# Patient Record
Sex: Female | Born: 1955 | Race: White | Hispanic: No | Marital: Married | Smoking: Former smoker
Health system: Southern US, Community
[De-identification: ages and names within clinical notes are randomized; demographics above are authoritative.]

## PROBLEM LIST (undated history)

## (undated) DIAGNOSIS — I10 Essential (primary) hypertension: Secondary | ICD-10-CM

## (undated) DIAGNOSIS — F32A Depression, unspecified: Secondary | ICD-10-CM

## (undated) DIAGNOSIS — F329 Major depressive disorder, single episode, unspecified: Secondary | ICD-10-CM

## (undated) DIAGNOSIS — K219 Gastro-esophageal reflux disease without esophagitis: Secondary | ICD-10-CM

## (undated) HISTORY — PX: CHOLECYSTECTOMY: SHX55

---

## 2004-12-18 ENCOUNTER — Ambulatory Visit: Payer: Self-pay | Admitting: General Surgery

## 2009-03-08 ENCOUNTER — Emergency Department: Payer: Self-pay | Admitting: Emergency Medicine

## 2009-10-17 ENCOUNTER — Emergency Department: Payer: Self-pay | Admitting: Emergency Medicine

## 2011-06-28 ENCOUNTER — Observation Stay: Payer: Self-pay | Admitting: Internal Medicine

## 2011-06-28 LAB — BASIC METABOLIC PANEL
Anion Gap: 9 (ref 7–16)
Chloride: 110 mmol/L — ABNORMAL HIGH (ref 98–107)
Co2: 26 mmol/L (ref 21–32)
Creatinine: 0.7 mg/dL (ref 0.60–1.30)
EGFR (African American): >60
Potassium: 3.6 mmol/L (ref 3.5–5.1)

## 2011-06-28 LAB — URINALYSIS, COMPLETE
Bilirubin,UR: NEGATIVE
Glucose,UR: NEGATIVE mg/dL (ref 0–75)
Nitrite: NEGATIVE
Ph: 5 (ref 4.5–8.0)
WBC UR: 5 /HPF (ref 0–5)

## 2011-06-28 LAB — CBC
HGB: 13.2 g/dL (ref 12.0–16.0)
MCH: 32.4 pg (ref 26.0–34.0)
MCV: 95 fL (ref 80–100)
Platelet: 220 10*3/uL (ref 150–440)
RBC: 4.07 10*6/uL (ref 3.80–5.20)
RDW: 13.8 % (ref 11.5–14.5)
WBC: 5.3 10*3/uL (ref 3.6–11.0)

## 2012-01-28 ENCOUNTER — Ambulatory Visit: Payer: Self-pay | Admitting: Internal Medicine

## 2012-10-08 ENCOUNTER — Ambulatory Visit: Payer: Self-pay

## 2014-05-15 IMAGING — CR DG KNEE 1-2V*L*
1 series · 4 of 4 positions shown · non-contrast
Comparison: none

REASON FOR EXAM: back and hip pain
COMMENTS:

PROCEDURE:     DXR - DXR KNEE LEFT AP AND LATERAL  - October 08, 2012 [DATE]
RESULT:     No acute bony or joint abnormality. No evidence of fracture.

[Series 1: t knee ap left · 0.14mm/px · 4 of 4 slices shown]
[im 1/4]
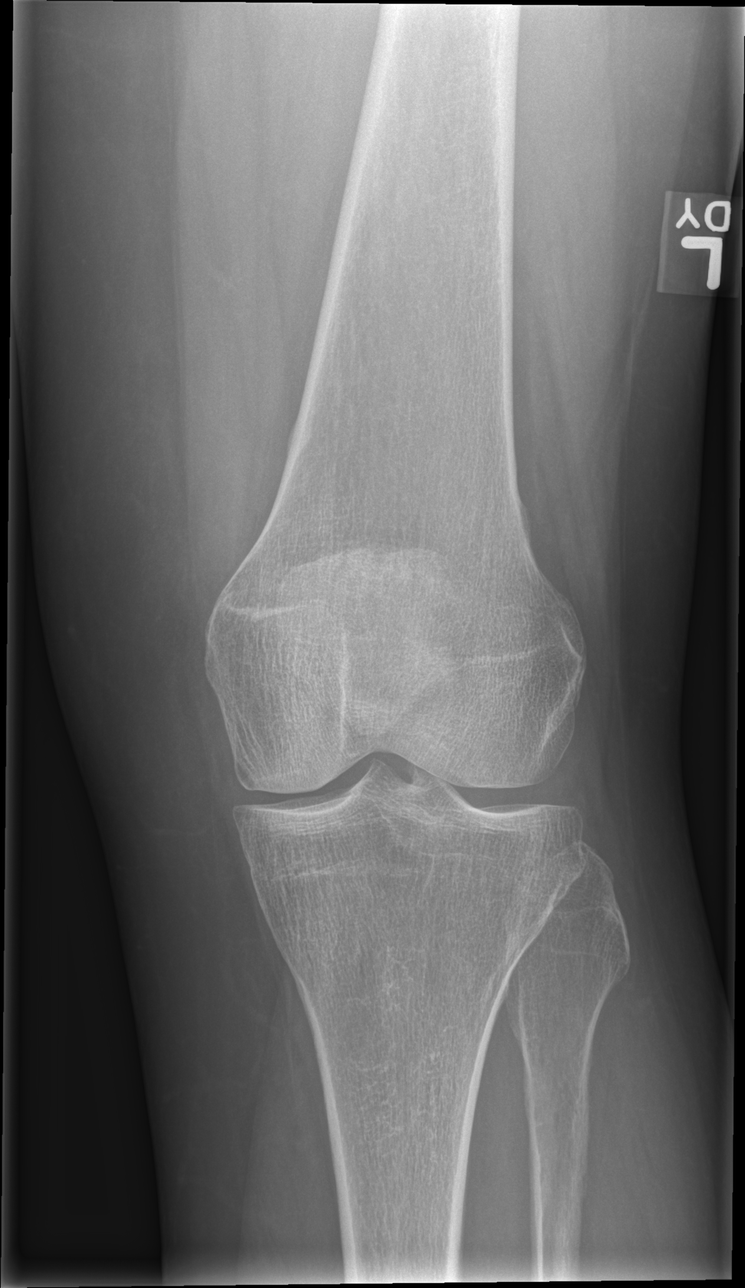
[im 2/4]
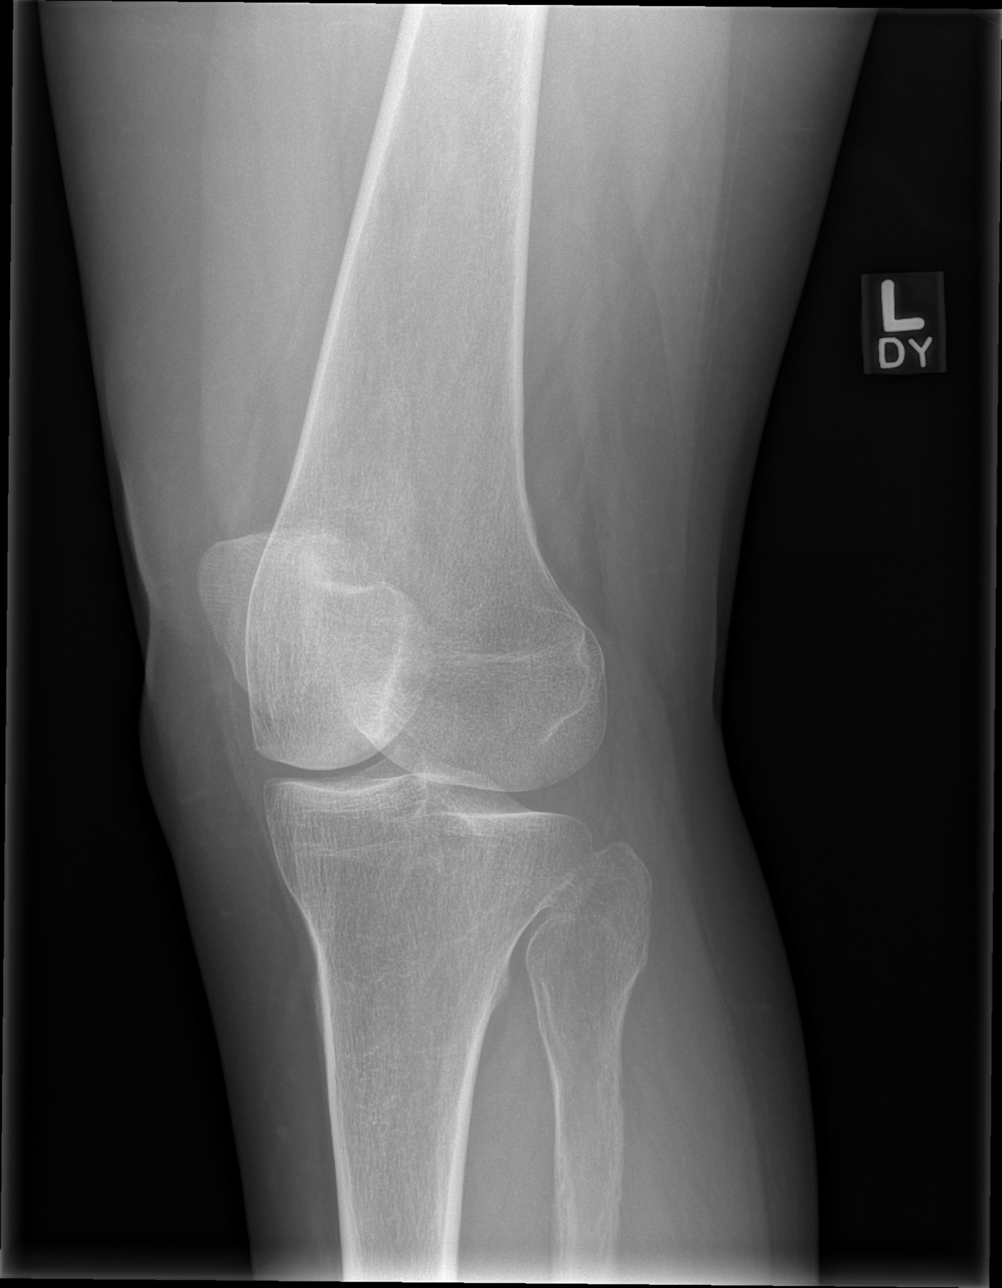
[im 3/4]
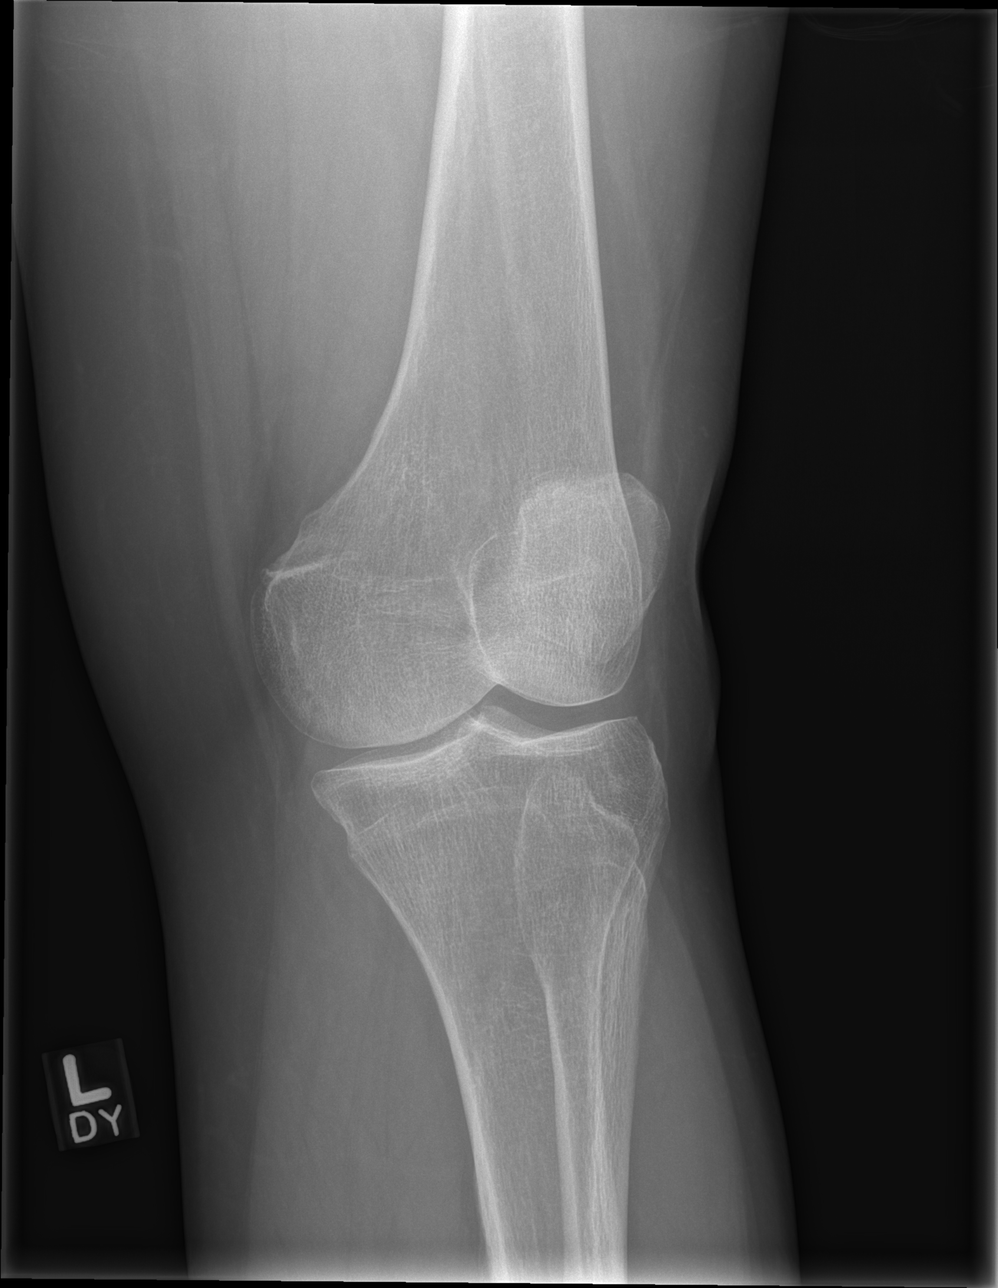
[im 4/4]
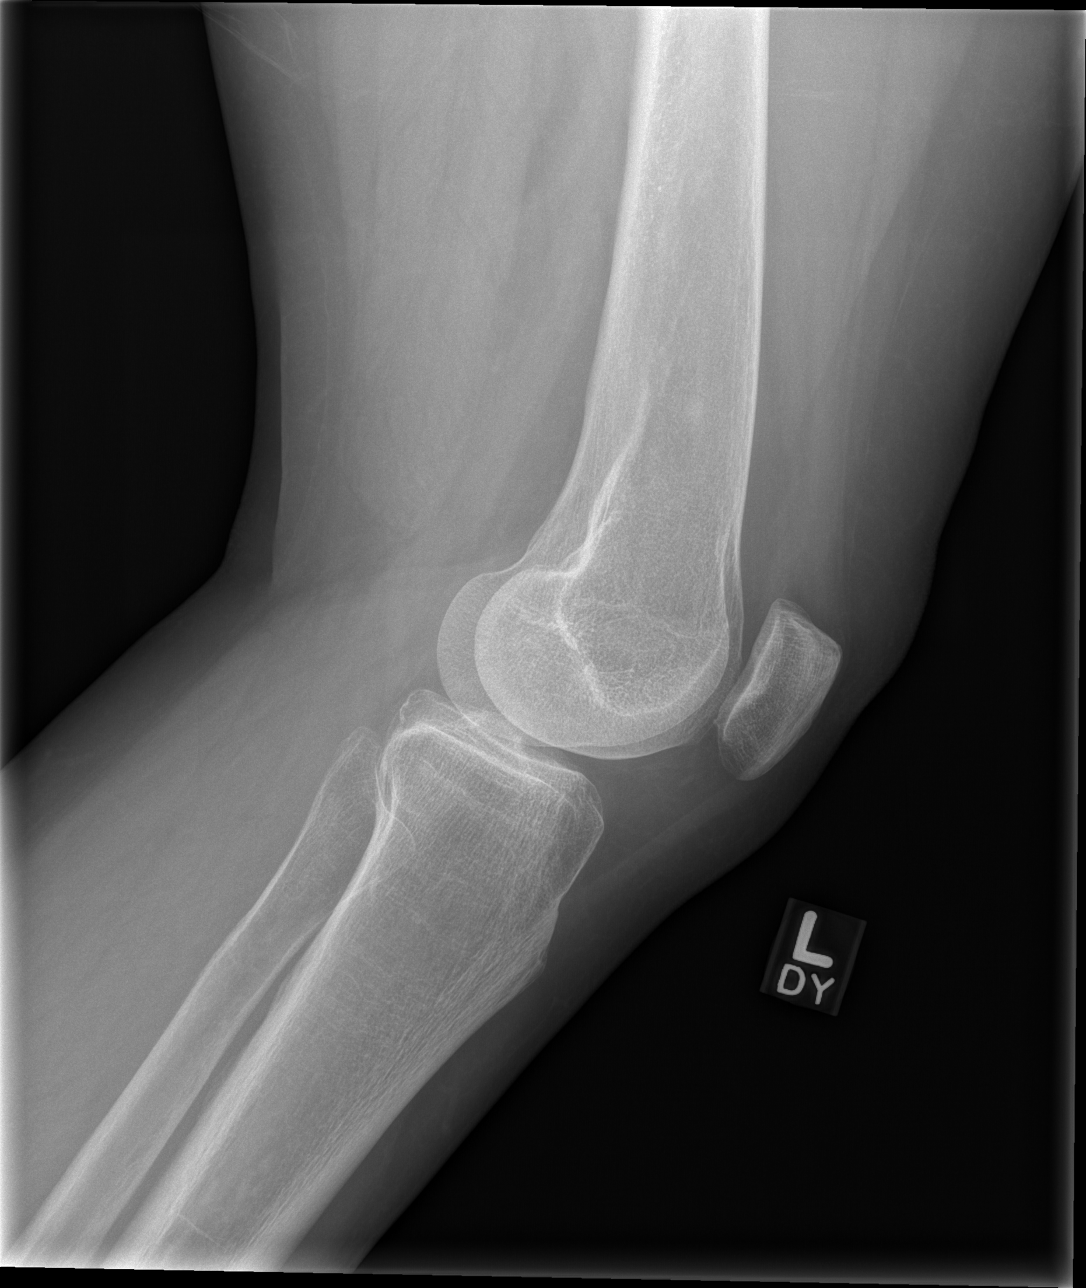

[4 of 4 positions shown; findings below may reference images not displayed]

IMPRESSION: No acute abnormality.

## 2014-05-15 IMAGING — CR DG LUMBAR SPINE 2-3V
1 series · 3 of 3 positions shown · non-contrast
Comparison: none

REASON FOR EXAM: back and hip pain
COMMENTS:

PROCEDURE:     DXR - DXR LUMBAR SPINE AP AND LATERAL  - October 08, 2012 [DATE]
RESULT:     Diffuse degenerative change. No evidence of fracture. Pedicles
are intact. SI joints unremarkable. Surgical clips right upper quadrant.

[Series 1: t lumbar spine ap · 0.14mm/px · 3 of 3 slices shown]
[im 1/3]
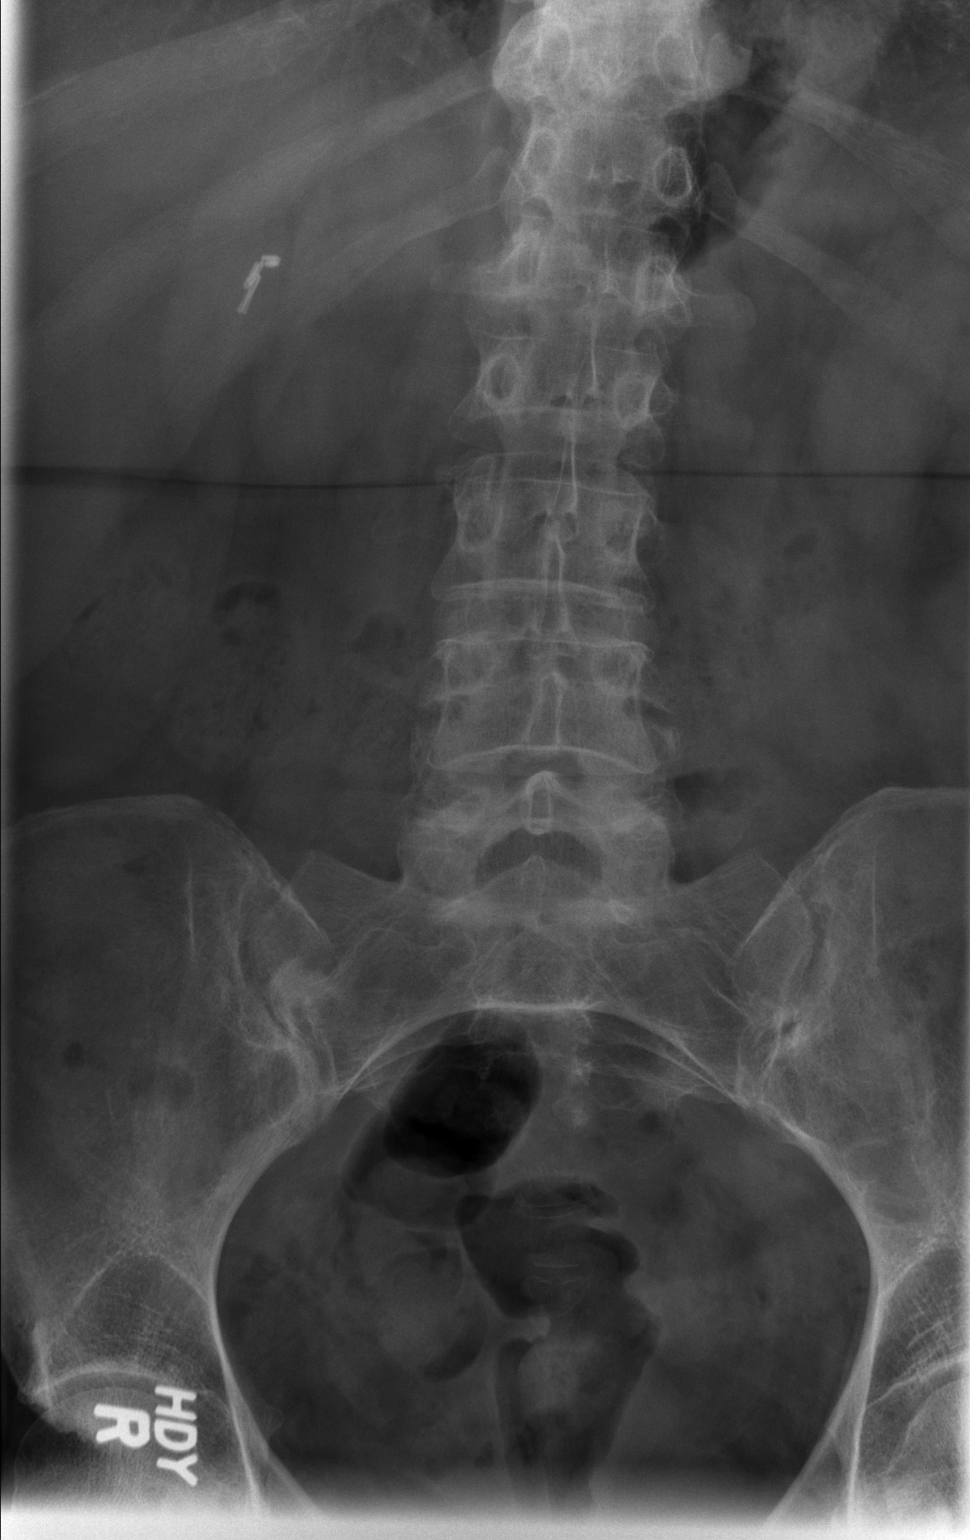
[im 2/3]
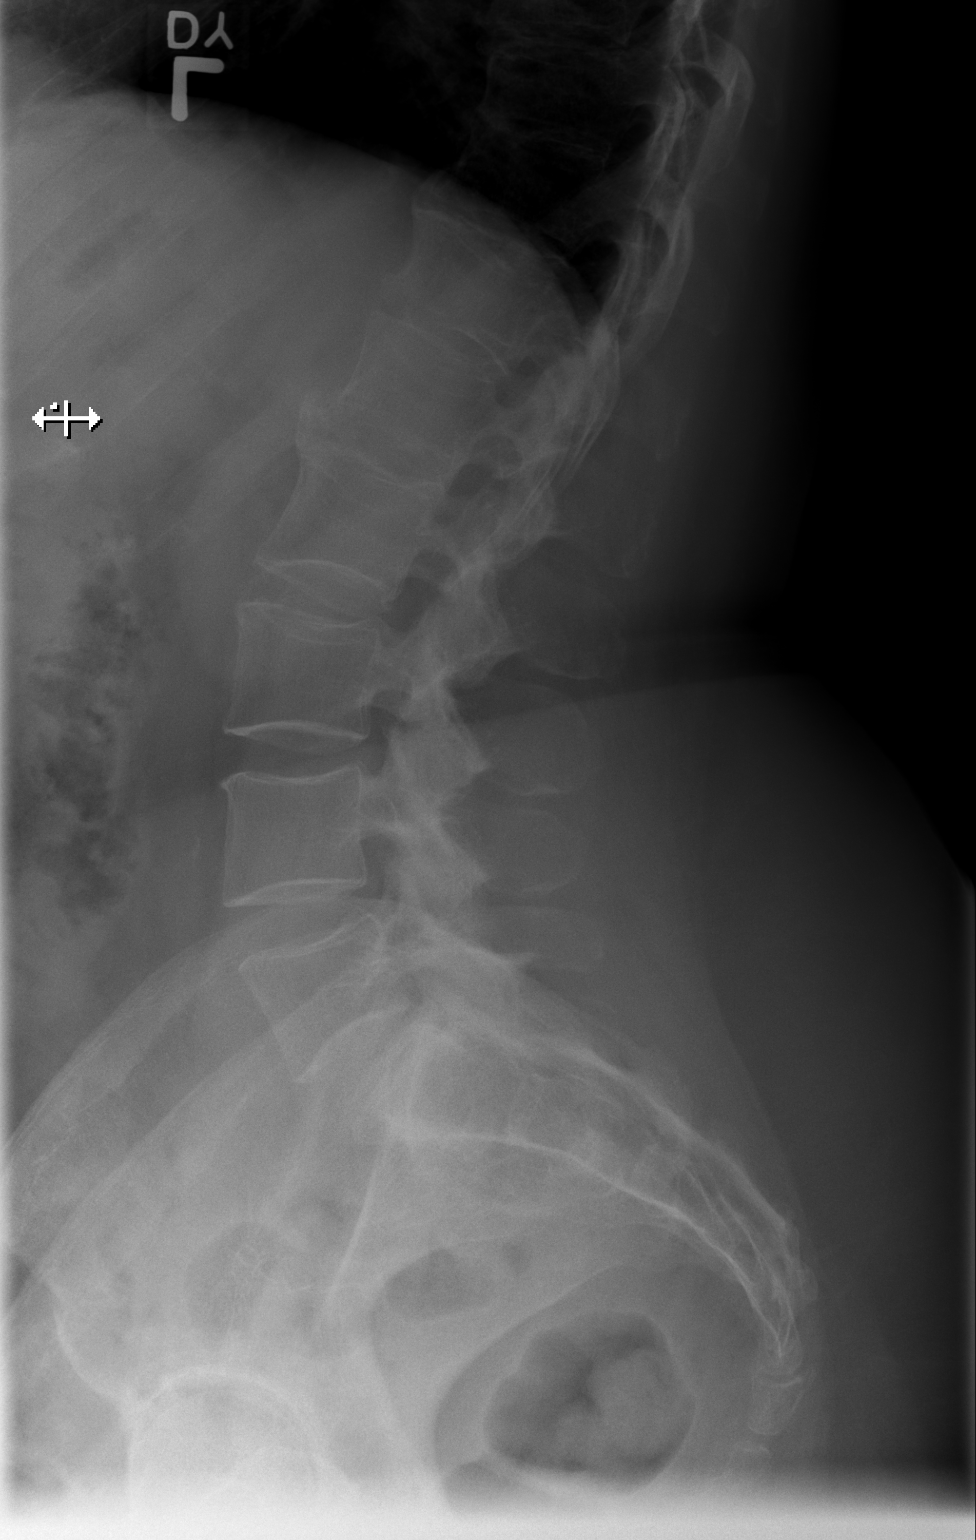
[im 3/3]
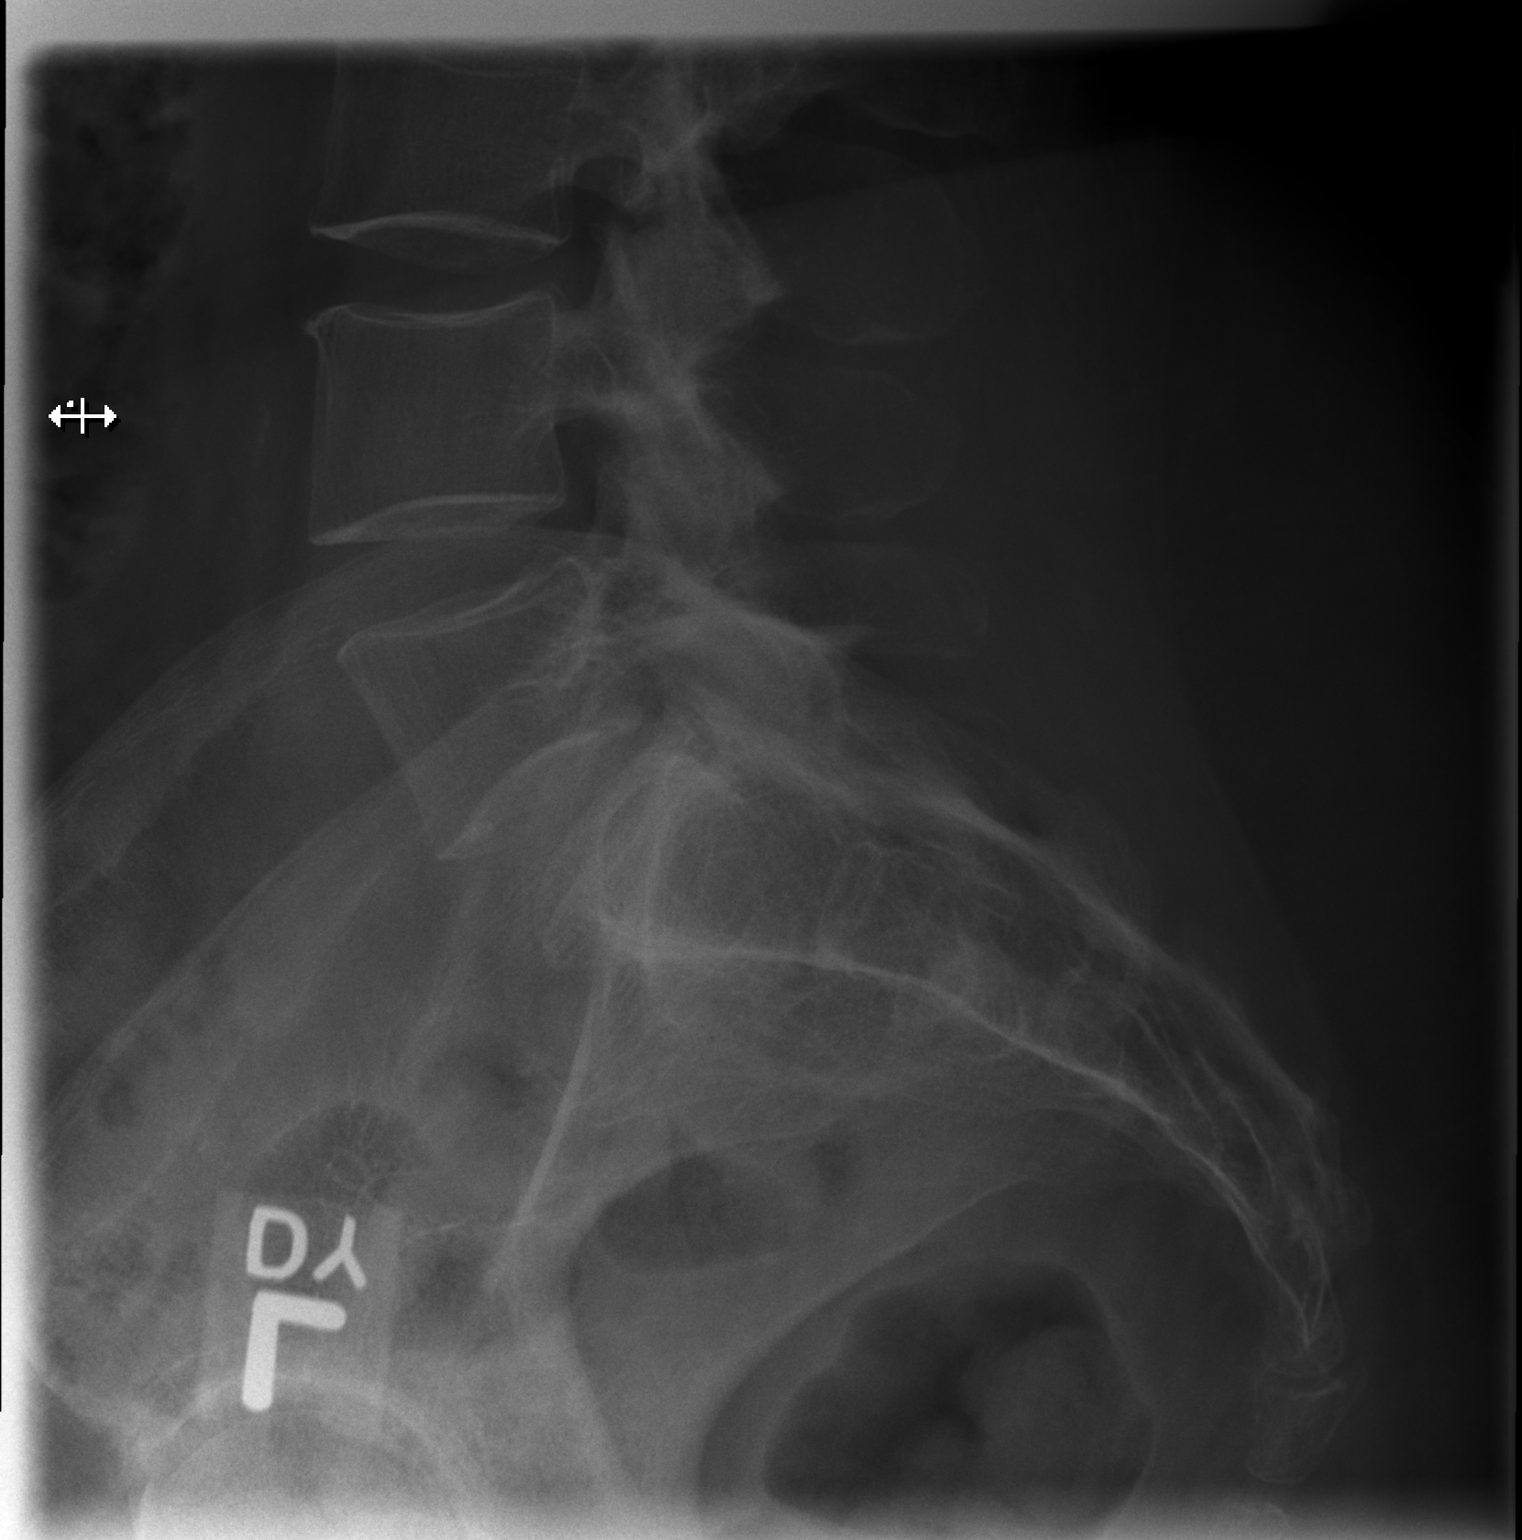

[3 of 3 positions shown; findings below may reference images not displayed]

IMPRESSION: Diffuse degenerative change. No acute abnormality.

## 2014-10-15 NOTE — H&P (Signed)
PATIENT NAME:  KEITH, CANCIO MR#:  [id number] DATE OF BIRTH:  February 15, 1956  DATE OF ADMISSION:  06/28/2011  PRIMARY CARE PHYSICIAN: Dr. Vonita Moss    CHIEF COMPLAINT: Swelling of the upper lip.   HISTORY OF PRESENT ILLNESS: This is a 59 year old female who has history of hypertension, depression, and gastroesophageal reflux disease. She has been taking lisinopril for six months now. Last night around 12 midnight she started having some swelling of the upper lip. It started on the left side. She thought probably her dentures were irritating it, and the swelling progressed to the right side of the upper lip also. She showed her mother. Because the swelling was progressing she presented to the Emergency Room. She never had lip swelling in the past. She was also complaining of feeling something different in the skin below the right eye. She denies any difficulty breathing or difficulty swallowing, but she said she feels something different in her throat and something in the lower part of her throat which she describes as acid reflux. She is trying to drink soda and she said with the lip swelling it is difficult to suck with a straw.  She also has somewhat different sensation in the throat when the fluid goes through. She denies any chest pain. She is complaining of a mild headache. No dizziness. She never had this kind of reaction in the past. She got one dose of 25 mg of IV Benadryl, 20 mg of IV Pepcid, and 125 mg of IV Solu-Medrol in the Emergency Room. The patient is being admitted for angioedema, most likely secondary to ACE inhibitor. She denies any tongue swelling. No change in vision.   PAST MEDICAL HISTORY:  1. Hypertension.  2. Depression.   3. Gastroesophageal reflux disease.  PAST SURGICAL HISTORY:  1. Cholecystectomy.  2. Cesarean section.   ALLERGIES TO MEDICATIONS: Codeine and Topamax.    HOME MEDICATIONS:  1. Lisinopril 10 mg daily.  2. Paxil 30 mg daily.  3. Multivitamin.   4. Calcium.  5. B12. Previously she was taking omeprazole also but she cannot afford it now.   SOCIAL HISTORY: She lives with her mother and is taking care of her granddaughter. She denies any smoking or alcohol use. She is unemployed right now. She was laid off.   FAMILY HISTORY: Positive for diabetes, her dad has heart problems and an uncle had bypass surgery.   REVIEW OF SYSTEMS: She denies any fever or weakness. She was complaining of not feeling well for the last two days. No acute change in vision. She is complaining of mild headache. No dizziness. Complaining of lip swelling. Complaining of abnormal sensation in the throat. No cough. No dyspnea. No painful respiration. No dyspnea on exertion. No orthopnea. No palpitations. No syncope.  No nausea, vomiting, abdominal pain, or gastrointestinal bleed.  No dysuria. No frequency.  She is complaining of some loose stools. She says whatever she eats is going through. She is complaining of some pressure in her bladder but she denies any dysuria. No anemia. No bleeding from any site.  No rash. No joint pains or swelling.  No focal numbness or weakness. She has depression.   PHYSICAL EXAMINATION:  VITAL SIGNS: When she presented to the Emergency Room, temperature 96.8, heart rate 71, respiratory 18, blood pressure 133/72, saturating 98% on room air.   GENERAL: This is a middle-aged, overweight Caucasian female comfortably lying in bed, in no acute distress. She has obvious upper lip swelling.   HEENT: Bilateral  pupils are equal and reactive to light. Extraocular muscles intact. No scleral icterus. No conjunctivitis. Oral mucosa is moist. No pallor. She has significant upper lip swelling. She does not have upper dentures. She has lower dentures. She has no tongue swelling. I can see the back of the throat and uvula. She has no throat swelling and I could not appreciate any swelling of the infraorbital tissues, mainly the upper lip right now.   NECK:  No thyroid tenderness, enlargement, or nodule. Neck is supple. No masses, nontender. No adenopathy. No JVD. No carotid bruit.   CHEST: Bilateral breath sounds are clear. No wheeze. Normal effort. No respiratory distress.   HEART: Heart sounds are regular. No murmur. Good peripheral pulses. No lower extremity edema.   ABDOMEN: Soft, nontender. Normal bowel sounds. No hepatosplenomegaly, no bruit, no masses.   RECTAL: Deferred.   NEUROLOGIC: She is awake, alert, oriented to time, place, and person. Cranial nerves are intact. Moving all extremities against gravity.   EXTREMITIES: No cyanosis, no clubbing.   SKIN: No rash. No lesions.   LABORATORY DATA:  Pending at this time.   IMPRESSION:  1. Angioedema, most likely secondary to ACE inhibitor.  2. Hypertension.  3. Depression.  4. Gastroesophageal reflux disease.   PLAN:  59 year old female with history of hypertension and depression. She says she has not taken any new medications recently;  she has been taking lisinopril for the last six months. She has no previous history of angioedema. She presented with upper lip swelling that started around 12 midnight. She denies any tongue swelling, but she has an abnormal sensation in her throat. She denies any difficulty breathing at this time.  Her last dose of lisinopril was yesterday. We will continue on IV Benadryl, IV Zantac, and IV Solu-Medrol. We will give her a clear liquid diet. We will give her some IV hydration. Lisinopril and ACE inhibitors will be listed as allergies with angioedema. I have told her that she cannot take lisinopril or any -pril in the future. She can be substituted for some other antihypertensive agent when she is discharged, probably Norvasc. We will continue her Paxil.  She is complaining of some pressure in the bladder and some loose stools. I am going to check a urinalysis. We will admit her to observation. I have told her that sometimes the swelling and the angioedema  can progress and we are keeping her for monitoring of that.    TIME SPENT ON ADMISSION AND COORDINATION OF CARE:  45 minutes.   ____________________________ Fredia SorrowAbhinav Edwardo Wojnarowski, MD ag:bjt D: 06/28/2011 08:45:05 ET T: 06/28/2011 09:57:53 ET JOB#: [id number]  cc: Fredia SorrowAbhinav Melody Cirrincione, MD, <Dictator> Steele SizerMark A. Crissman, MD Fredia SorrowABHINAV Latesha Chesney MD ELECTRONICALLY SIGNED 07/11/2011 12:29

## 2014-10-15 NOTE — Discharge Summary (Signed)
PATIENT NAME:  Brittany Edwards, Brittany Edwards MR#:  [id number] DATE OF BIRTH:  25-Nov-1955  DATE OF ADMISSION:  06/28/2011 DATE OF DISCHARGE:  06/29/2011  PRIMARY CARE PHYSICIAN: Vonita MossMark Crissman, MD   DISCHARGE DIAGNOSES:   1. Angioedema secondary to lisinopril. No angiotensin-converting-enzyme inhibitor (ACE inhibitors) or angiotensin receptor blockers (ARBS) in the future.  2. Hypertension.  3. Depression.  4. Gastroesophageal reflux disease.   MEDICATIONS ON DISCHARGE:  1. Paxil 30 mg p.o. daily.  2. Multivitamin 1 tablet daily.  3. Os-Cal 1250 mg daily.  4. Prednisone taper as written on prescription. 5. Zantac 150 mg p.o. b.i.d. for one week.  6. Benadryl 25 mg p.o. every 4 to 6 hours as needed for swelling.   NOTE: Do not take lisinopril.   DIET: Low sodium diet.   ACTIVITY: Activity as tolerated.   FOLLOWUP: Follow up in 1 to 2 weeks with Dr. Dossie Arbourrissman of the [facility].   REASON FOR ADMISSION: The patient was admitted 06/28/2011 and discharged 06/29/2011, came in with swelling of the upper lip.   HISTORY OF PRESENT ILLNESS:  The patient is a 59 year old woman with hypertension, depression, gastroesophageal reflux disease. She has been on lisinopril for six months. She developed swelling of the upper lip, came to the Emergency Room, was given Benadryl, Pepcid and Solu-Medrol. The patient was admitted for an observation for angioedema, continued on Benadryl, Solu-Medrol and Pepcid.   HOSPITAL LABORATORY, DIAGNOSTIC AND RADIOLOGICAL DATA:  White blood cell count of 5.3, hemoglobin and hematocrit 13.2 and 38.8, platelet count of 220. Glucose 93, BUN 8, creatinine 0.70, sodium 145, potassium 3.6, chloride 110, CO2 26, calcium 8.7.  Urinalysis: 1+ blood.   HOSPITAL COURSE PER PROBLEM LIST:  1. Angioedema: This was secondary to the lisinopril. No ACE inhibitors or ARBS are recommended in the future. Lisinopril was stopped. The patient will be given a prednisone taper upon discharge, Zantac and  Benadryl as needed. The patient's lip swelling had improved. The patient tolerated a regular diet. No wheezing or respiratory compromise.  2. Hypertension: No new medications given at this time. Blood pressure is controlled upon discharge, 128/82.  3. Depression: She is on Paxil.  4. Gastroesophageal reflux disease: Zantac is given.   TIME SPENT ON DISCHARGE: 35 minutes.  ____________________________ Herschell Dimesichard J. Renae GlossWieting, MD rjw:cbb D: 06/29/2011 15:05:07 ET T: 07/01/2011 10:45:41 ET JOB#: [id number]  cc: Herschell Dimesichard J. Renae GlossWieting, MD, <Dictator> Steele SizerMark A. Crissman, MD [facility] Salley ScarletICHARD J Angellina Ferdinand MD ELECTRONICALLY SIGNED 07/11/2011 19:37

## 2015-01-16 ENCOUNTER — Other Ambulatory Visit: Payer: Self-pay | Admitting: Nurse Practitioner

## 2015-01-16 DIAGNOSIS — Z1231 Encounter for screening mammogram for malignant neoplasm of breast: Secondary | ICD-10-CM

## 2015-08-03 ENCOUNTER — Emergency Department
Admission: EM | Admit: 2015-08-03 | Discharge: 2015-08-03 | Disposition: A | Discharge status: Home / Self Care | Payer: Medicare Other | Attending: Emergency Medicine | Admitting: Emergency Medicine

## 2015-08-03 ENCOUNTER — Emergency Department: Payer: Medicare Other

## 2015-08-03 ENCOUNTER — Encounter: Payer: Self-pay | Admitting: Emergency Medicine

## 2015-08-03 DIAGNOSIS — R0789 Other chest pain: Secondary | ICD-10-CM | POA: Insufficient documentation

## 2015-08-03 DIAGNOSIS — I10 Essential (primary) hypertension: Secondary | ICD-10-CM | POA: Insufficient documentation

## 2015-08-03 DIAGNOSIS — M25512 Pain in left shoulder: Secondary | ICD-10-CM | POA: Insufficient documentation

## 2015-08-03 DIAGNOSIS — R079 Chest pain, unspecified: Secondary | ICD-10-CM | POA: Diagnosis present

## 2015-08-03 HISTORY — DX: Major depressive disorder, single episode, unspecified: F32.9

## 2015-08-03 HISTORY — DX: Essential (primary) hypertension: I10

## 2015-08-03 HISTORY — DX: Gastro-esophageal reflux disease without esophagitis: K21.9

## 2015-08-03 HISTORY — DX: Depression, unspecified: F32.A

## 2015-08-03 LAB — CBC
HCT: 42.1 % (ref 35.0–47.0)
HEMOGLOBIN: 14.2 g/dL (ref 12.0–16.0)
MCH: 32.4 pg (ref 26.0–34.0)
MCHC: 33.8 g/dL (ref 32.0–36.0)
MCV: 95.8 fL (ref 80.0–100.0)
Platelets: 217 10*3/uL (ref 150–440)
RBC: 4.39 MIL/uL (ref 3.80–5.20)
RDW: 14.3 % (ref 11.5–14.5)
WBC: 6 10*3/uL (ref 3.6–11.0)

## 2015-08-03 LAB — BASIC METABOLIC PANEL
Anion gap: 3 — ABNORMAL LOW (ref 5–15)
BUN: 11 mg/dL (ref 6–20)
CALCIUM: 8.7 mg/dL — AB (ref 8.9–10.3)
CO2: 29 mmol/L (ref 22–32)
CREATININE: 0.68 mg/dL (ref 0.44–1.00)
Chloride: 107 mmol/L (ref 101–111)
GFR calc Af Amer: >60 mL/min (ref >60)
GLUCOSE: 99 mg/dL (ref 65–99)
Potassium: 3.8 mmol/L (ref 3.5–5.1)
Sodium: 139 mmol/L (ref 135–145)

## 2015-08-03 LAB — TROPONIN I

## 2015-08-03 NOTE — ED Provider Notes (Signed)
[facility] Emergency Department Provider Note     Time seen: ----------------------------------------- 8:28 PM on 08/03/2015 -----------------------------------------    I have reviewed the triage vital signs and the nursing notes.   HISTORY  Chief Complaint Chest Pain    HPI Brittany Edwards is a 60 y.o. female who presents ER for left-sided breast chest and shoulder pain. Patient states pain radiates into her left arm, describes as intermittent and sharp. Pressure on the chest and movement of the shoulder worsens or symptoms. Patient has not had a history of this before. She did have an injury in this area 3 months ago.   Past Medical History  Diagnosis Date  . Hypertension   . GERD (gastroesophageal reflux disease)   . Depression     There are no active problems to display for this patient.   Past Surgical History  Procedure Laterality Date  . Cholecystectomy      Allergies Codeine and Lisinopril  Social History Social History  Substance Use Topics  . Smoking status: Never Smoker   . Smokeless tobacco: None  . Alcohol Use: Yes     Comment: occasional    Review of Systems Constitutional: Negative for fever. Eyes: Negative for visual changes. ENT: Negative for sore throat. Cardiovascular: Positive for chest pain Respiratory: Negative for shortness of breath Gastrointestinal: Negative for abdominal pain, vomiting and diarrhea. Genitourinary: Negative for dysuria. Musculoskeletal: Positive left shoulder pain Skin: Negative for rash. Neurological: Negative for headaches, focal weakness or numbness.  10-point ROS otherwise negative.  ____________________________________________   PHYSICAL EXAM:  VITAL SIGNS: ED Triage Vitals  Enc Vitals Group     BP 08/03/15 1856 146/81 mmHg     Pulse Rate 08/03/15 1856 68     Resp 08/03/15 1856 18     Temp 08/03/15 1856 98.1 F (36.7 C)     Temp Source 08/03/15 1856 Oral     SpO2  08/03/15 1856 97 %     Weight 08/03/15 1856 215 lb (97.523 kg)     Height 08/03/15 1856  (1.6 m)     Head Cir --      Peak Flow --      Pain Score 08/03/15 1850 6     Pain Loc --      Pain Edu? --      Excl. in GC? --     Constitutional: Alert and oriented. Well appearing and in no distress. Eyes: Conjunctivae are normal. PERRL. Normal extraocular movements. ENT   Head: Normocephalic and atraumatic.   Nose: No congestion/rhinnorhea.   Mouth/Throat: Mucous membranes are moist.   Neck: No stridor. Cardiovascular: Normal rate, regular rhythm. Normal and symmetric distal pulses are present in all extremities. No murmurs, rubs, or gallops. Respiratory: Normal respiratory effort without tachypnea nor retractions. Breath sounds are clear and equal bilaterally. No wheezes/rales/rhonchi. Gastrointestinal: Soft and nontender. No distention. No abdominal bruits.  Musculoskeletal: Left chest wall is tender to touch, left shoulder is tender to touch. Some pain is elicited with range of motion left shoulder. Neurologic:  Normal speech and language. No gross focal neurologic deficits are appreciated. Speech is normal. No gait instability. Skin:  Skin is warm, dry and intact. No rash noted. Psychiatric: Mood and affect are normal. Speech and behavior are normal. Patient exhibits appropriate insight and judgment. ____________________________________________  EKG: Interpreted by me. Normal sinus rhythm with rate of 67 bpm, normal PR interval, wide QRS, normal QT interval. Right bundle branch block, left axis deviation  ____________________________________________  ED COURSE:  Pertinent labs & imaging results that were available during my care of the patient were reviewed by me and considered in my medical decision making (see chart for details). Patient is low risk for ACS, will check basic labs and reevaluate. ____________________________________________    LABS (pertinent  positives/negatives)  Labs Reviewed  BASIC METABOLIC PANEL - Abnormal; Notable for the following:    Calcium 8.7 (*)    Anion gap 3 (*)    All other components within normal limits  CBC  TROPONIN I    RADIOLOGY  IMPRESSION: Lungs are clear and there is no evidence of acute cardiopulmonary abnormality.  ____________________________________________  FINAL ASSESSMENT AND PLAN  Chest pain  Plan: Patient with labs and imaging as dictated above. Pain seems noncardiac. I will advise anti-inflammatory and muscle relaxants. She is advised to have close follow-up with her doctor for reevaluation.   Emily Filbert, MD   Emily Filbert, MD 08/03/15 2030

## 2015-08-03 NOTE — Discharge Instructions (Signed)

## 2015-08-03 NOTE — ED Notes (Signed)
Pt states that she noticed pain under left breast two days ago and is radiating down left arm. Pt states the pain is a intermittent sharp pain.

## 2016-12-07 ENCOUNTER — Emergency Department
Admission: EM | Admit: 2016-12-07 | Discharge: 2016-12-07 | Disposition: A | Discharge status: Home / Self Care | Payer: Medicare HMO | Attending: Emergency Medicine | Admitting: Emergency Medicine

## 2016-12-07 ENCOUNTER — Emergency Department: Payer: Medicare HMO

## 2016-12-07 DIAGNOSIS — Z87891 Personal history of nicotine dependence: Secondary | ICD-10-CM | POA: Diagnosis not present

## 2016-12-07 DIAGNOSIS — R0789 Other chest pain: Secondary | ICD-10-CM | POA: Diagnosis present

## 2016-12-07 DIAGNOSIS — R0602 Shortness of breath: Secondary | ICD-10-CM | POA: Insufficient documentation

## 2016-12-07 DIAGNOSIS — I10 Essential (primary) hypertension: Secondary | ICD-10-CM | POA: Insufficient documentation

## 2016-12-07 LAB — BASIC METABOLIC PANEL
Anion gap: 10 (ref 5–15)
BUN: 8 mg/dL (ref 6–20)
CALCIUM: 8.3 mg/dL — AB (ref 8.9–10.3)
CHLORIDE: 103 mmol/L (ref 101–111)
CO2: 24 mmol/L (ref 22–32)
CREATININE: 0.84 mg/dL (ref 0.44–1.00)
GFR calc Af Amer: >60 mL/min (ref >60)
GFR calc non Af Amer: >60 mL/min (ref >60)
Glucose, Bld: 178 mg/dL — ABNORMAL HIGH (ref 65–99)
Potassium: 3.3 mmol/L — ABNORMAL LOW (ref 3.5–5.1)
Sodium: 137 mmol/L (ref 135–145)

## 2016-12-07 LAB — CBC
HCT: 44.3 % (ref 35.0–47.0)
Hemoglobin: 15.5 g/dL (ref 12.0–16.0)
MCH: 33.5 pg (ref 26.0–34.0)
MCHC: 34.8 g/dL (ref 32.0–36.0)
MCV: 96.2 fL (ref 80.0–100.0)
PLATELETS: 177 10*3/uL (ref 150–440)
RBC: 4.61 MIL/uL (ref 3.80–5.20)
RDW: 13.1 % (ref 11.5–14.5)
WBC: 5.3 10*3/uL (ref 3.6–11.0)

## 2016-12-07 LAB — TROPONIN I

## 2016-12-07 MED ORDER — KETOROLAC TROMETHAMINE 30 MG/ML IJ SOLN
30.0000 mg | Freq: Once | INTRAMUSCULAR | Status: AC
  Administered 2016-12-07: 30 mg via INTRAMUSCULAR
  Filled 2016-12-07: qty 1

## 2016-12-07 NOTE — ED Notes (Signed)
Pt assisted to get out of bed to use the stretcher; pt is able to ambulate independently, and displays an even, steady gait.

## 2016-12-07 NOTE — ED Provider Notes (Signed)
[facility] Emergency Department Provider Note  ____________________________________________   First MD Initiated Contact with Patient 12/07/16 1626     (approximate)  I have reviewed the triage vital signs and the nursing notes.   HISTORY  Chief Complaint Chest Pain   HPI Brittany PottDonna M Edwards is a 61 y.o. female with a history of hypertension who is presenting to the emergency department with right-sided chest pain over the past 24 hours. Says that the pain is sharp and moderate to severe specimen when she coughs or takes a deep breath. She says that she has been doing a lot of heavy lifting and moving around her house over the past week. She says that the pain is especially bad when she moves her right arm. Says that she is also experiencing some shortness of breath when the pain worsens. She denies any radiation of the pain. Points to a spot just under her right breast when describing the location of the pain. Denies any diaphoresis, nausea or vomiting. No history of heart disease.   Past Medical History:  Diagnosis Date  . Depression   . GERD (gastroesophageal reflux disease)   . Hypertension     There are no active problems to display for this patient.   Past Surgical History:  Procedure Laterality Date  . CHOLECYSTECTOMY      Prior to Admission medications   Not on File    Allergies Codeine and Lisinopril  No family history on file.  Social History Social History  Substance Use Topics  . Smoking status: Former Games developermoker  . Smokeless tobacco: Never Used  . Alcohol use Yes     Comment: occasional    Review of Systems  Constitutional: No fever/chills Eyes: No visual changes. ENT: No sore throat. Cardiovascular: as above Respiratory: Denies shortness of breath. Gastrointestinal: No abdominal pain.  No nausea, no vomiting.  No diarrhea.  No constipation. Genitourinary: Negative for dysuria. Musculoskeletal: Negative for back pain. Skin:  Negative for rash. Neurological: Negative for headaches, focal weakness or numbness.   ____________________________________________   PHYSICAL EXAM:  VITAL SIGNS: ED Triage Vitals  Enc Vitals Group     BP 12/07/16 1506 (!) 177/93     Pulse Rate 12/07/16 1506 94     Resp 12/07/16 1506 18     Temp 12/07/16 1506 98 F (36.7 C)     Temp Source 12/07/16 1506 Oral     SpO2 12/07/16 1506 95 %     Weight 12/07/16 1506 220 lb (99.8 kg)     Height 12/07/16 1506 5\' 3"  (1.6 m)     Head Circumference --      Peak Flow --      Pain Score 12/07/16 1505 6     Pain Loc --      Pain Edu? --      Excl. in GC? --     Constitutional: Alert and oriented. Well appearing and in no acute distress. Eyes: Conjunctivae are normal.  Head: Atraumatic. Nose: No congestion/rhinnorhea. Mouth/Throat: Mucous membranes are moist.  Neck: No stridor.   Cardiovascular: Normal rate, regular rhythm. Grossly normal heart sounds.    Strongly reproducible pain over the right anterior chest wall. There is no ecchymosis or deformity visualized. No rash.  Respiratory: Normal respiratory effort.  No retractions. Lungs CTAB. Gastrointestinal: Soft and nontender. No distention.  Musculoskeletal: No lower extremity tenderness nor edema.  No joint effusions. Neurologic:  Normal speech and language. No gross focal neurologic deficits are appreciated.  Skin:  Skin is warm, dry and intact. No rash noted. Psychiatric: Mood and affect are normal. Speech and behavior are normal.  ____________________________________________   LABS (all labs ordered are listed, but only abnormal results are displayed)  Labs Reviewed  BASIC METABOLIC PANEL - Abnormal; Notable for the following:       Result Value   Potassium 3.3 (*)    Glucose, Bld 178 (*)    Calcium 8.3 (*)    All other components within normal limits  CBC  TROPONIN I   ____________________________________________  EKG  ED ECG REPORT I, Arelia Longest,  the attending physician, personally viewed and interpreted this ECG.   Date: 12/07/2016  EKG Time: 1509  Rate: 86  Rhythm: normal sinus rhythm  Axis: Normal  Intervals:right bundle branch block and left anterior fascicular block  ST&T Change: No ST segment elevation or depression. T-wave inversions in V2. Also with biphasic T waves in V3.  Change from EKGs on the record from the past.  ____________________________________________  RADIOLOGY  No acute finding on the chest x-ray. ____________________________________________   PROCEDURES  Procedure(s) performed:   Procedures  Critical Care performed:   ____________________________________________   INITIAL IMPRESSION / ASSESSMENT AND PLAN / ED COURSE  Pertinent labs & imaging results that were available during my care of the patient were reviewed by me and considered in my medical decision making (see chart for details).  ----------------------------------------- 7:05 PM on 12/07/2016 -----------------------------------------  Exam is consistent with chest wall pain. Patient with also a lot of pain when abduction the right upper extremity. However, now after dose of Toradol she is able to range the right upper extremity to a more full range of motion and says that he "took the edge off the pain." Likely chest wall pain. She'll follow-up with her primary care doctor. Recommended Aleve as well as icy hot for further relief. Patient is understanding the plan and willing to comply.      ____________________________________________   FINAL CLINICAL IMPRESSION(S) / ED DIAGNOSES  Chest wall pain.    NEW MEDICATIONS STARTED DURING THIS VISIT:  New Prescriptions   No medications on file     Note:  This document was prepared using Dragon voice recognition software and may include unintentional dictation errors.     [name], MD 12/07/16 [id number]

## 2016-12-07 NOTE — ED Triage Notes (Signed)
Pt reports to ED w/ c/o R CP under R breast. Pt sts pain started last night. Pt c/o pain e/ deep breaths. Pt A/OX4. Pt denies radiation, n/v/d or LOC.  Resp even and unlabored. Pt denies injury. NAD.

## 2017-03-09 IMAGING — CR DG CHEST 2V
1 series · 2 of 2 positions shown · non-contrast
Comparison: Chest x-ray dated 10/18/2009.

CLINICAL DATA: Pain under left breast 2 days ago, radiating down
left arm pain. Pain is intermittent and sharp.

EXAM:
CHEST  2 VIEW

[Series 1: dg chest 2 view · 0.14mm/px · 2 of 2 slices shown]
[im 1/2]
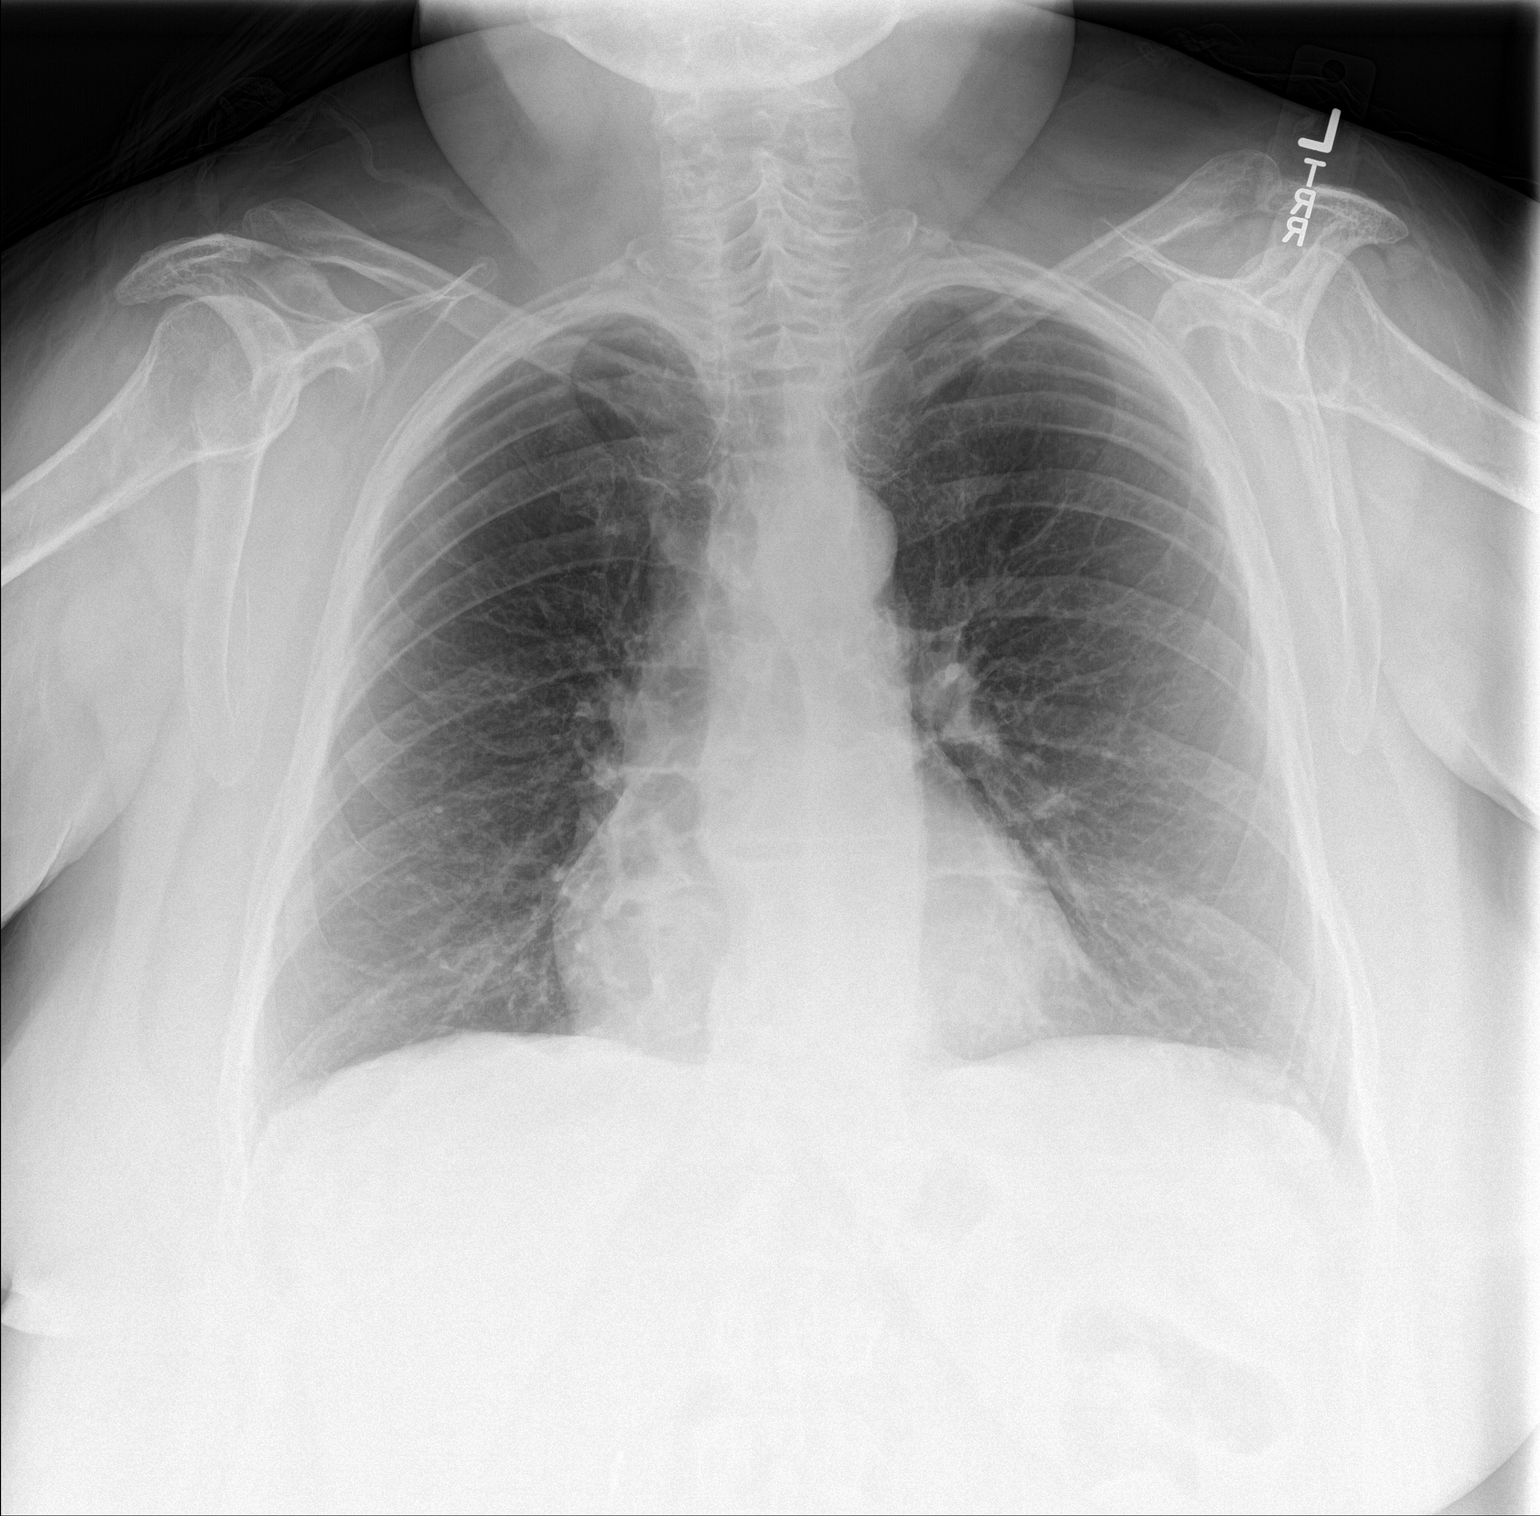
[im 2/2]
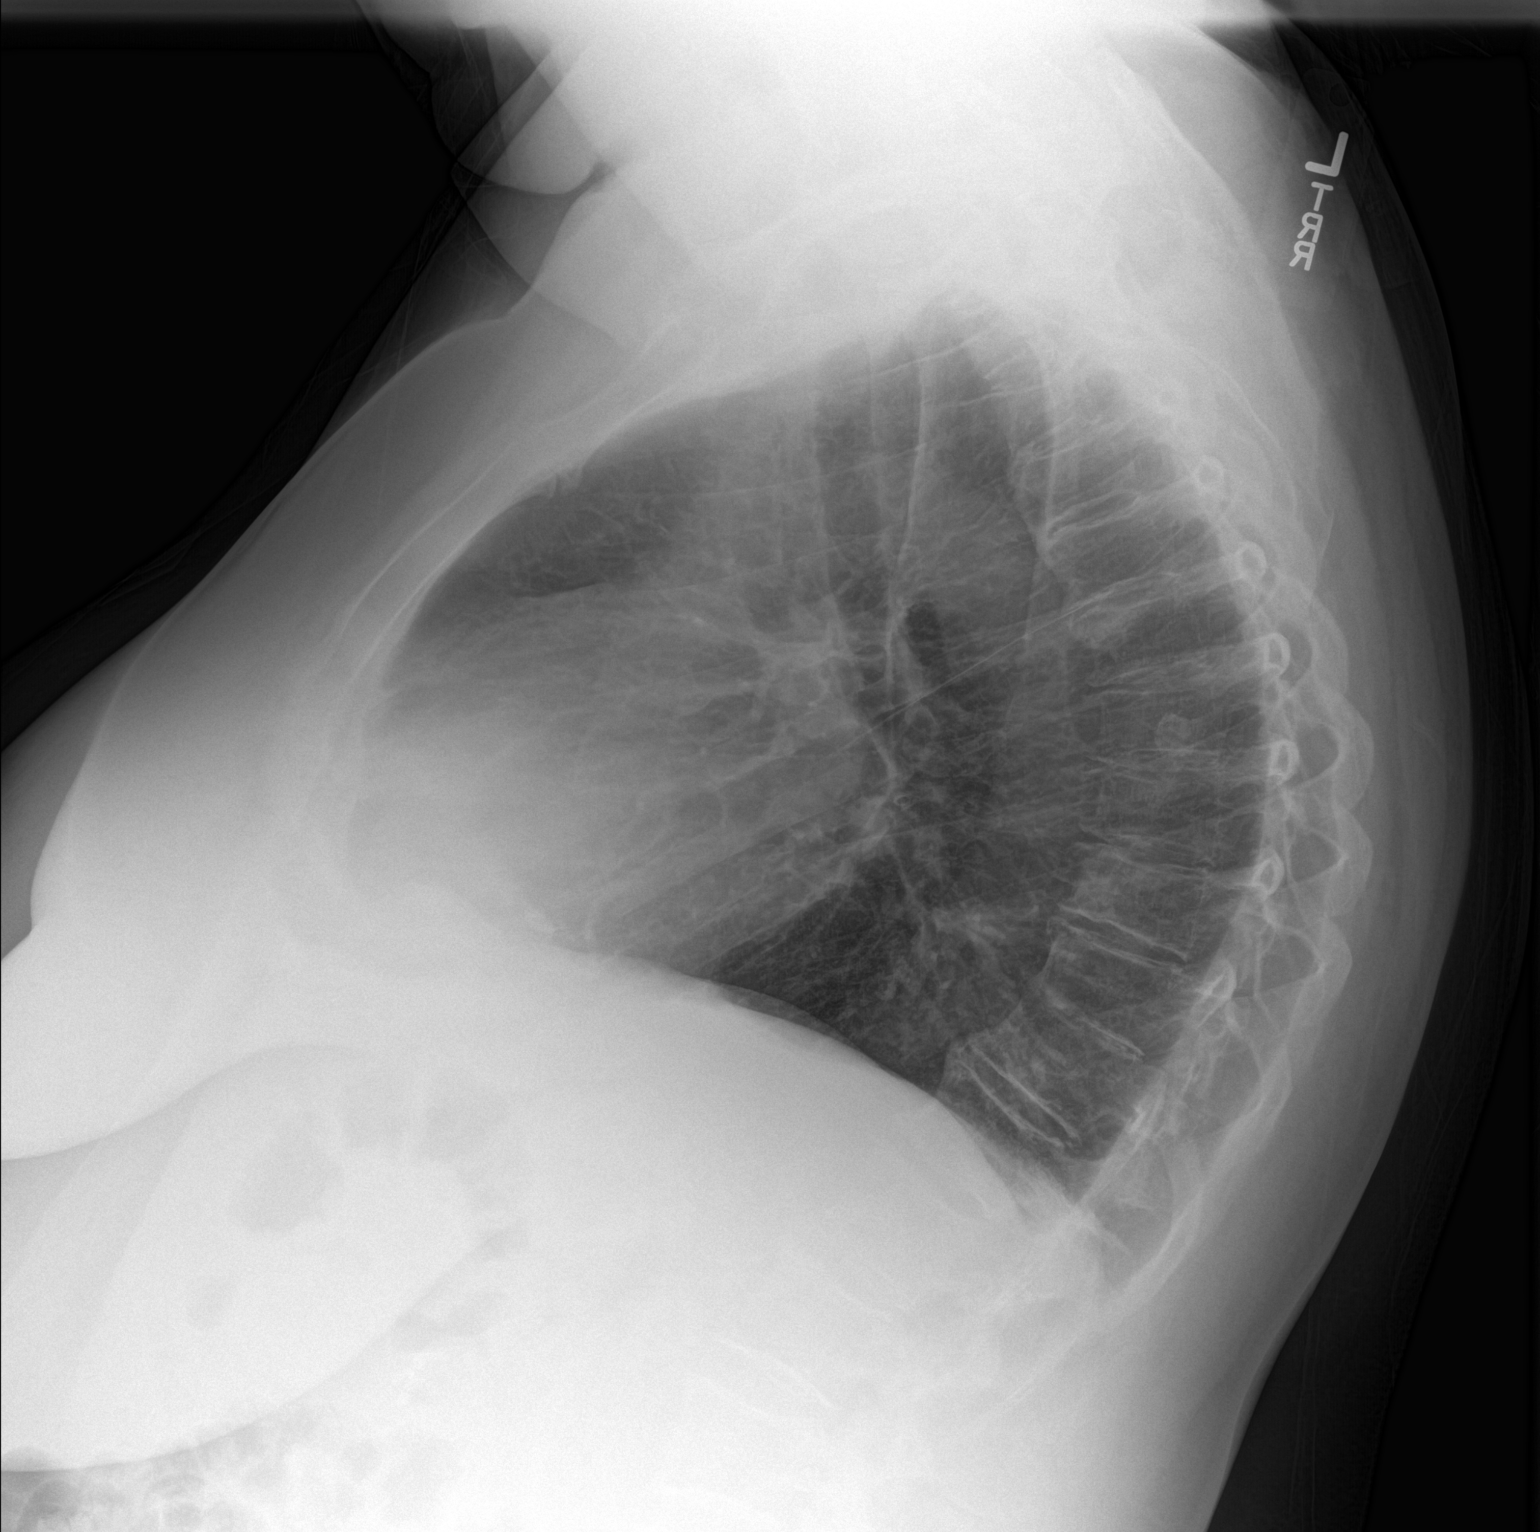

[2 of 2 positions shown; findings below may reference images not displayed]

FINDINGS: Heart size is normal. Overall cardiomediastinal silhouette is stable
in size and configuration.

Lungs are clear. Lung volumes are normal. No pleural effusion seen.
No pneumothorax seen.

Degenerative changes are seen throughout the kyphotic thoracic
spine, stable. No acute- appearing osseous abnormality. Soft tissues
about the chest are unremarkable.
IMPRESSION: Lungs are clear and there is no evidence of acute cardiopulmonary
abnormality.

## 2018-07-14 IMAGING — CR DG CHEST 2V
2 series · 2 of 2 positions shown · non-contrast
Comparison: 08/03/2015 chest radiograph.

CLINICAL DATA: Chest pain

EXAM:
CHEST  2 VIEW

[chest pa]
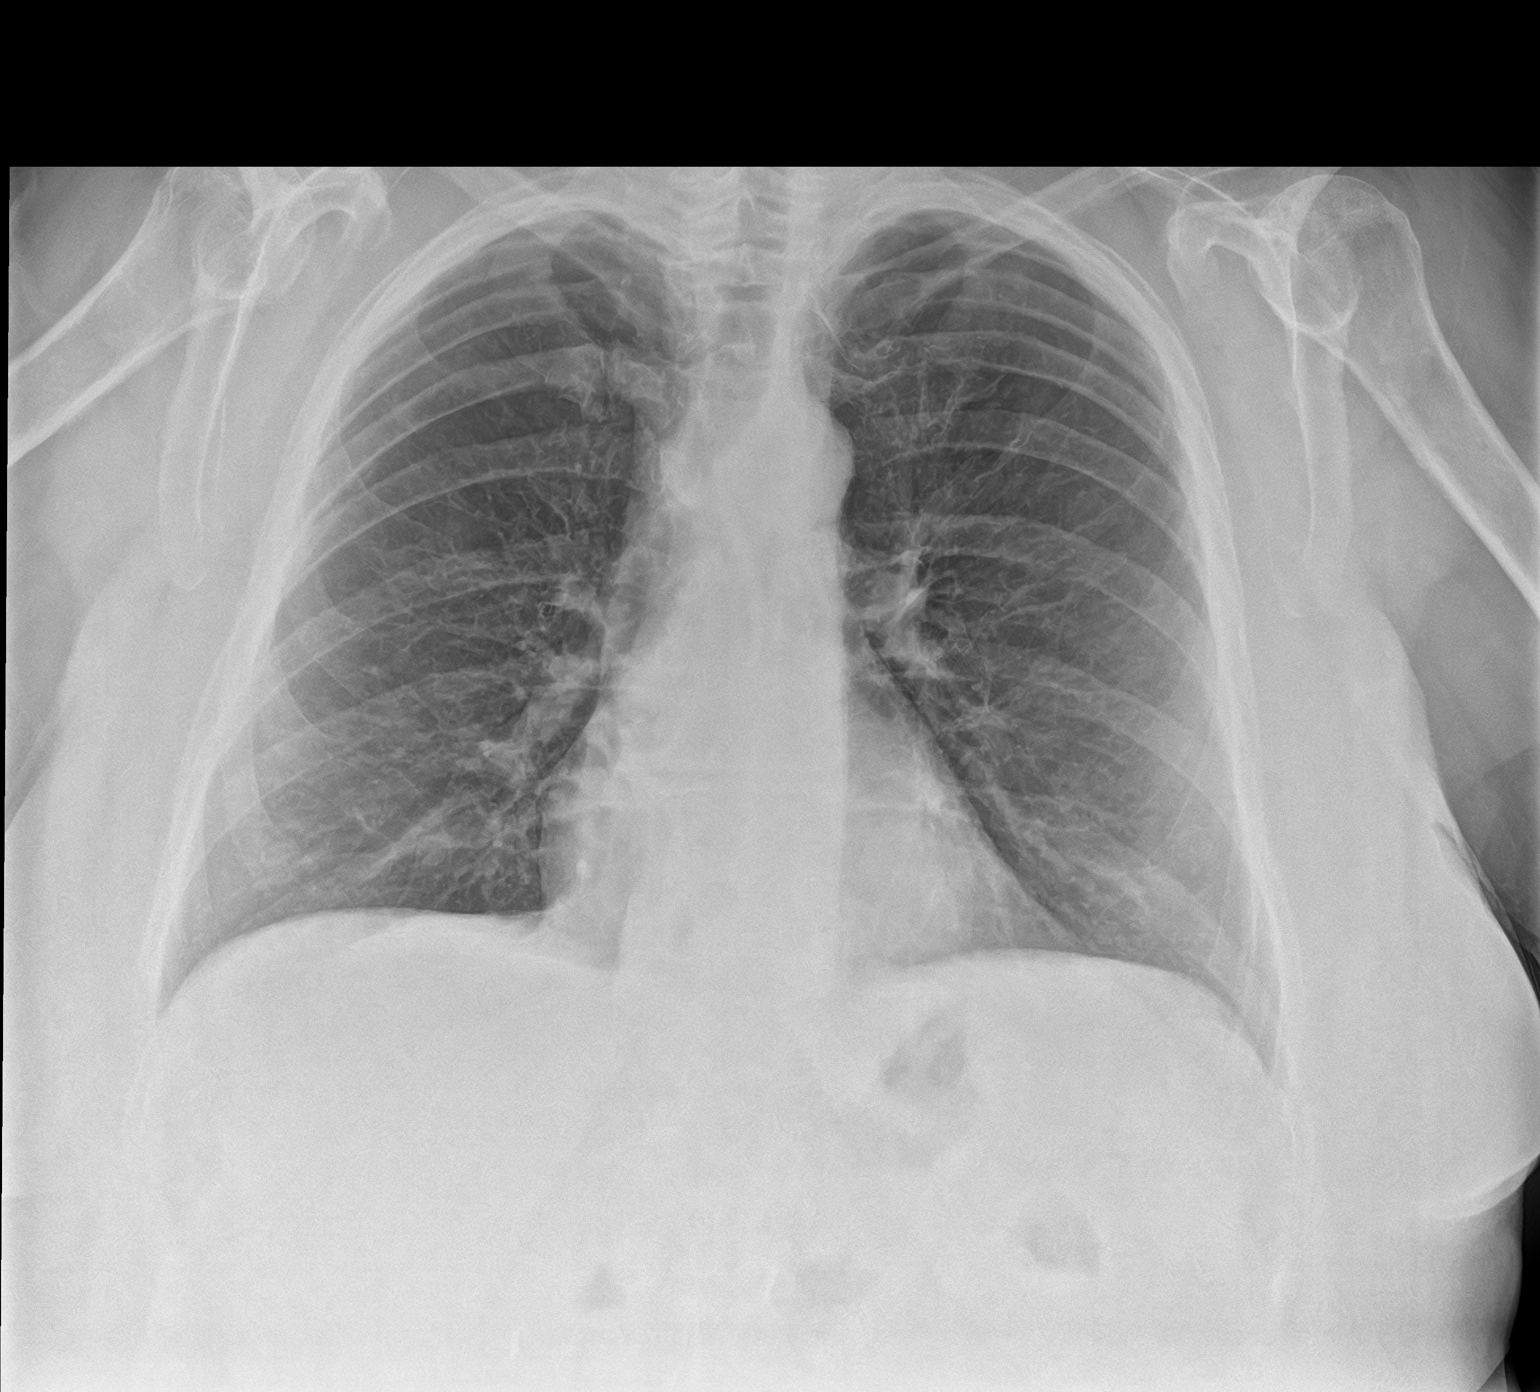

[w chest lat]
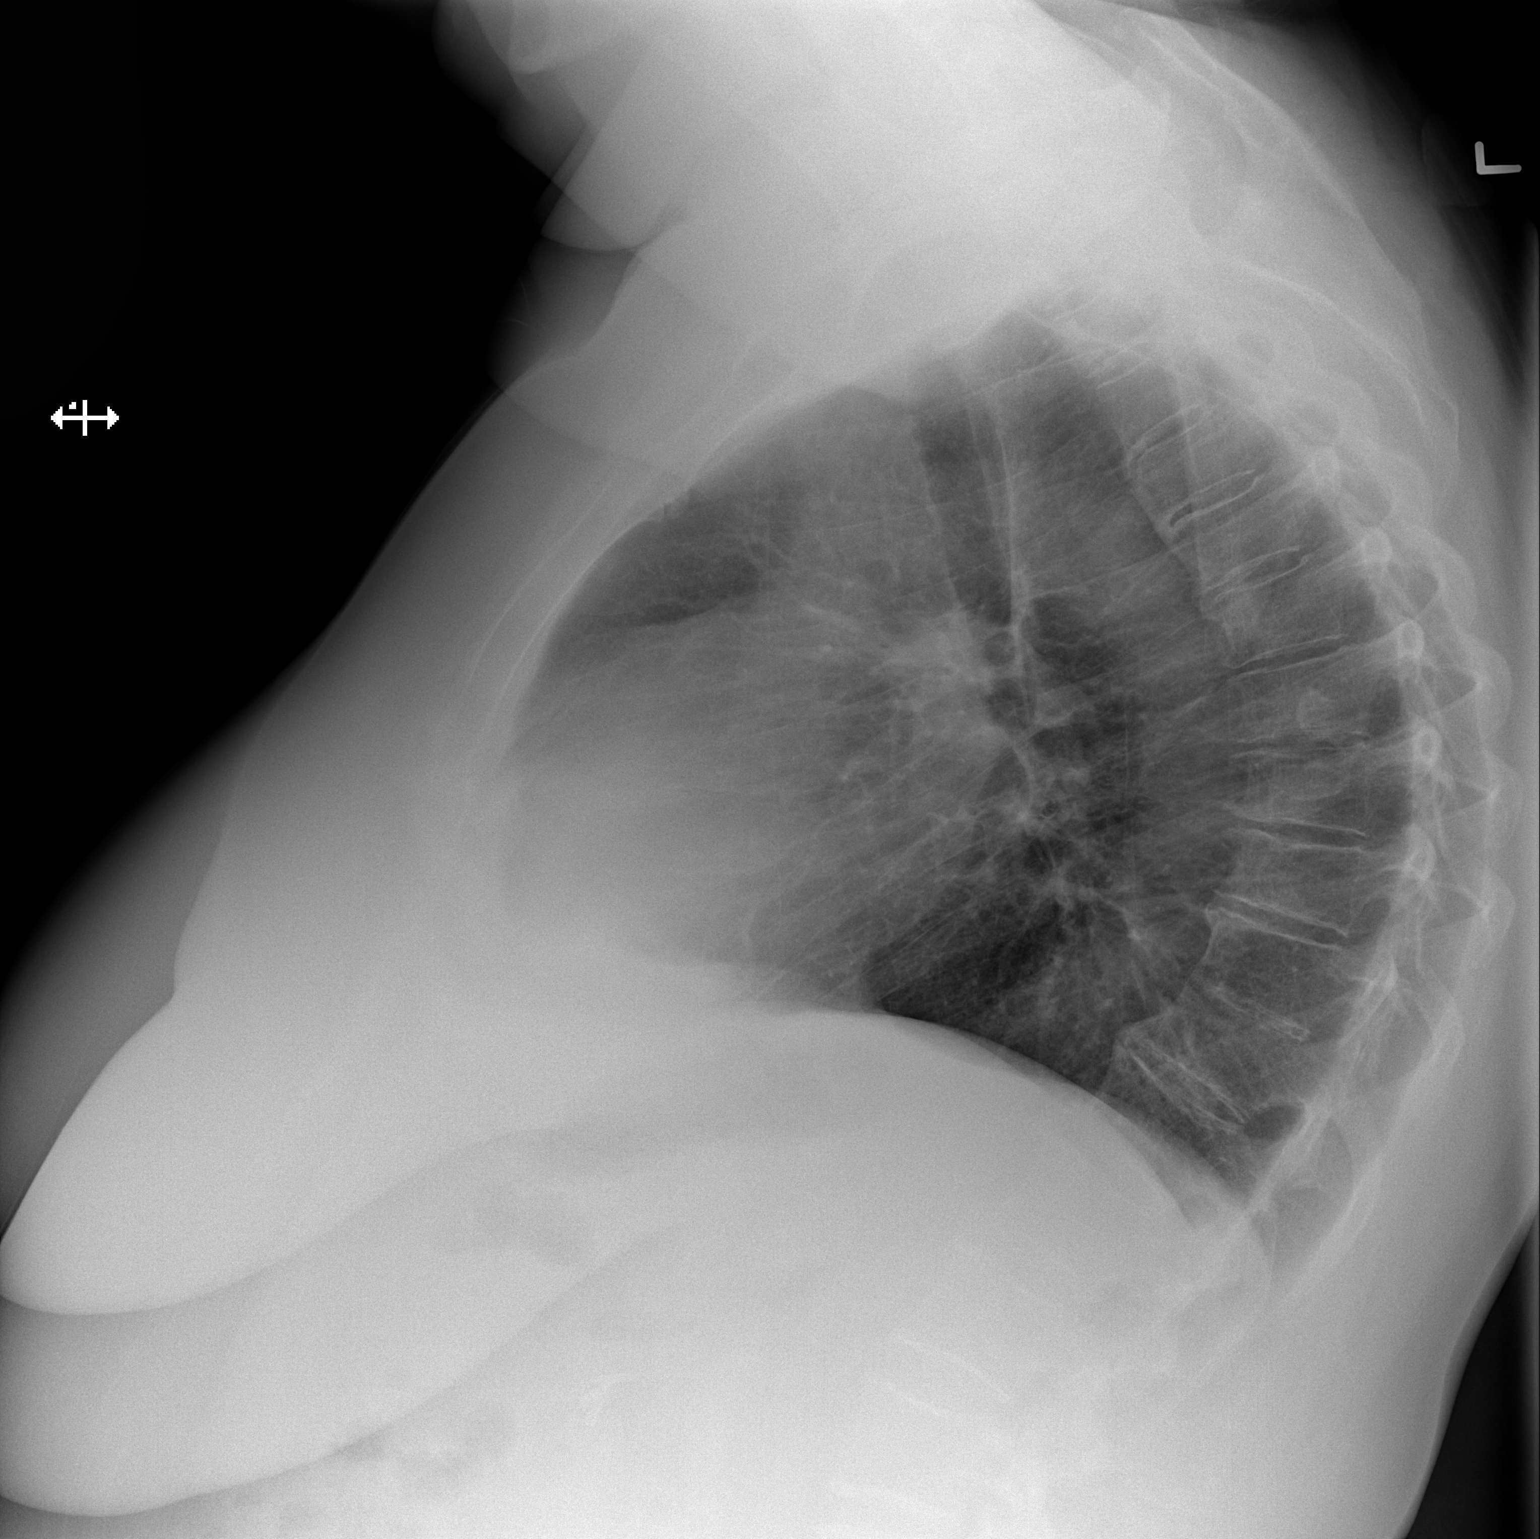

[2 of 2 positions shown; findings below may reference images not displayed]

FINDINGS: Stable cardiomediastinal silhouette with normal heart size. No
pneumothorax. No pleural effusion. Lungs appear clear, with no acute
consolidative airspace disease and no pulmonary edema.
Cholecystectomy clips are seen in the right upper quadrant of the
abdomen.
IMPRESSION: No active cardiopulmonary disease.

## 2022-03-19 DIAGNOSIS — Z139 Encounter for screening, unspecified: Secondary | ICD-10-CM

## 2022-03-19 LAB — GLUCOSE, POCT (MANUAL RESULT ENTRY): POC Glucose: 171 mg/dl — AB (ref 70–99)

## 2022-03-20 NOTE — Congregational Nurse Program (Signed)
  Dept: [phone]   Congregational Nurse Program Note  Date of Encounter: 03/19/2022  Past Medical History: Past Medical History:  Diagnosis Date   Depression    GERD (gastroesophageal reflux disease)    Hypertension     Encounter Details:  CNP Questionnaire - 03/19/22 1230       Questionnaire   Do you give verbal consent to treat you today? Yes    Location Patient Served  Not Applicable    Visit Lead or Organization    Patient Status Unknown   lives in mobile home   Insurance Medicare    Insurance Referral N/A    Medication N/A    Medical Provider Yes    Screening Referrals Annual Wellness Visit    Medical Referral [facility] PCP/Clinic    Medical Appointment Made N/A    Food Have Food Insecurities    Transportation N/A    Housing/Utilities N/A    Interpersonal Safety N/A    Intervention Blood pressure;Blood glucose;Educate;Spiritual Care    ED Visit Averted N/A    Life-Saving Intervention Made N/A           Client into nurse only clinic at [place] at 11:30 am 03/19/22 requesting BP and glucose checks. States BP was 149/107 yesterday at [facility] and was told that she needed to have BP assessed before further dental care sched in late Oct. Is in process of receiving dentures. Has just scheduled an initial visit with Lang Snow, [place] at [facility] for 04/29/22--last seen @ [facility] was 2017. States she has dental and vision coverage with insurance. Last vision screen was 2015 at Dr. Chyrl Civatte @ [facility]. Needs to sched appt for exam and glasses. States that she is not on any meds. BP 152/94 with large cuff. Glucose 171 non fasting. Co that both results are abnormal and warrant follow up with MD.  Co. Re: reading labels to assess salt load in foods and grams of sugar to become more aware of the volume of salt and sugar that are in her diet and to begin decreasing. Agrees to plan and to RTC in 1 wk for follow-up screening. States she  plans on walking at least once this month to begin steps towards exercising more. Reinforced upcoming MD, Dental and Vision appointments. To seek MD care sooner if BP remains elevated or if becomes symptomatic. Rhermann, RN

## 2022-03-26 DIAGNOSIS — Z139 Encounter for screening, unspecified: Secondary | ICD-10-CM

## 2022-03-26 LAB — GLUCOSE, POCT (MANUAL RESULT ENTRY): POC Glucose: 108 mg/dl — AB (ref 70–99)

## 2022-03-26 NOTE — Congregational Nurse Program (Signed)
  Dept: [phone]   Congregational Nurse Program Note  Date of Encounter: 03/26/2022  Past Medical History: Past Medical History:  Diagnosis Date   Depression    GERD (gastroesophageal reflux disease)    Hypertension     Encounter Details:  CNP Questionnaire - 03/26/22 1210       Questionnaire   Ask client: Do you give verbal consent for me to treat you today? Yes    Armed forces training and education officer Nurse    Location Patient Film/video editor, [employer]    Visit Setting with Client Organization    Patient Status Unknown    Insurance Medicare;Private or [place]    Insurance/Financial Assistance Referral N/A    Medication N/A    Medical Provider Yes    Screening Referrals Made N/A    Medical Referrals Made Urgent Care    Medical Appointment Made N/A    Recently w/o PCP, now 1st time PCP visit completed due to CNs referral or appointment made N/A    Food Have Food Insecurities    Transportation N/A    Housing/Utilities N/A    Interpersonal Safety N/A    Interventions Advocate/Support;Navigate Healthcare System;Educate    Abnormal to Normal Screening Since Last CN Visit Blood Glucose    Screenings CN Performed Blood Pressure;Blood Glucose    Sent Client to Lab for: N/A    Did client attend any of the following based off CNs referral or appointments made? N/A    ED Visit Averted N/A    Life-Saving Intervention Made N/A               Dept: [phone]   Congregational Nurse Program Note  Date of Encounter: 03/26/2022  Past Medical History: Past Medical History:  Diagnosis Date   Depression    GERD (gastroesophageal reflux disease)    Hypertension     Encounter Details:  [facility] BP and glucose monitoring. States that she has been monitoring BP over the past week at [facility] or [facility]  giving her results from 132/91 to 153/96. Dentist will not complete scheduled procedure 04/22/22 moving her to  much-needed dentures until MD sees for elevated BP. Has new patient appt with Lang Snow, FNP at [facility] clinic on 04/29/22. TC with patient to [facility] attempting to get seen for elevated BP but states policy is appt to establish as patient is necessary first. Recommends pt come to [facility] to have BP looked at. Client agrees to plan as BP was 132/90 today. Glucose reading improved to 108 non fasting today. Client states that she has decreased drinking 2 sodas a day to one and increased walking by parking away from stores and walking during some commercial breaks. Reinforced positive health behaviors. RTC in 1 wk for ongoing monitoring. Rhermann, RN

## 2022-04-02 NOTE — Congregational Nurse Program (Incomplete)
  Dept: [phone]   Congregational Nurse Program Note  Date of Encounter: 04/02/2022  Past Medical History: Past Medical History:  Diagnosis Date  . Depression   . GERD (gastroesophageal reflux disease)   . Hypertension     Encounter Details:     Dept: [phone]   Congregational Nurse Program Note  Date of Encounter: 04/02/2022  Past Medical History: Past Medical History:  Diagnosis Date  . Depression   . GERD (gastroesophageal reflux disease)   . Hypertension     Encounter Details:  CNP Questionnaire - 04/02/22 1230       Questionnaire   Ask client: Do you give verbal consent for me to treat you today? Yes    Student Assistance N/A    Location Patient Film/video editor, [employer]    Visit Setting with Science writer    Patient Status Unknown    Insurance Medicare;Private or [place]    Insurance/Financial Assistance Referral N/A    Medication N/A    Screening Referrals Made N/A    Medical Referrals Made N/A    Medical Appointment Made N/A    Recently w/o PCP, now 1st time PCP visit completed due to CNs referral or appointment made N/A    Food Have Food Insecurities    Transportation N/A    Housing/Utilities N/A    Interpersonal Safety N/A    Interventions Educate    Abnormal to Normal Screening Since Last CN Visit Blood Pressure    Screenings CN Performed N/A    Sent Client to Lab for: N/A    Did client attend any of the following based off CNs referral or appointments made? Yes;Medical    ED Visit Averted N/A    Life-Saving Intervention Made N/A               Dept: [phone]   Congregational Nurse Program Note  Date of Encounter: 04/02/2022  Past Medical History: Past Medical History:  Diagnosis Date  . Depression   . GERD (gastroesophageal reflux disease)   . Hypertension     Encounter Details:  client into nurse only clinic for follow up BP screening. She followed up with referral to [facility] urgent  care clinic to address elevated BP screenings over past few  weeks while awaiting enrollment visit in Nov with new Primary Care Provider. Started on 5 mg Amlodipine

## 2022-04-02 NOTE — Congregational Nurse Program (Signed)
  Dept: [phone]   Congregational Nurse Program Note  Date of Encounter: 04/02/2022  Past Medical History: Past Medical History:  Diagnosis Date   Depression    GERD (gastroesophageal reflux disease)    Hypertension     Encounter Details:     Dept: [phone]   Congregational Nurse Program Note  Date of Encounter: 04/02/2022  Past Medical History: Past Medical History:  Diagnosis Date   Depression    GERD (gastroesophageal reflux disease)    Hypertension     Encounter Details:  CNP Questionnaire - 04/02/22 1230       Questionnaire   Ask client: Do you give verbal consent for me to treat you today? Yes    Student Assistance N/A    Location Patient Film/video editor, [employer]    Visit Setting with Science writer    Patient Status Unknown    Insurance Medicare;Private or [place]    Insurance/Financial Assistance Referral N/A    Medication N/A    Screening Referrals Made N/A    Medical Referrals Made N/A    Medical Appointment Made N/A    Recently w/o PCP, now 1st time PCP visit completed due to CNs referral or appointment made N/A    Food Have Food Insecurities    Transportation N/A    Housing/Utilities N/A    Interpersonal Safety N/A    Interventions Educate    Abnormal to Normal Screening Since Last CN Visit Blood Pressure    Screenings CN Performed N/A    Sent Client to Lab for: N/A    Did client attend any of the following based off CNs referral or appointments made? Yes;Medical    ED Visit Averted N/A    Life-Saving Intervention Made N/A               Dept: [phone]   Congregational Nurse Program Note  Date of Encounter: 04/02/2022  Past Medical History: Past Medical History:  Diagnosis Date   Depression    GERD (gastroesophageal reflux disease)    Hypertension     Encounter Details:  client into nurse only clinic for follow up BP screening. She followed up with referral to [facility] urgent care  clinic to address elevated BP screenings over past few  weeks while awaiting enrollment visit in Nov with new Primary Care Provider. Started on 5 mg Amlodipine which she has been taking daily since last Thursday. Reinforced reducing salt intake, taking meds as prescribed and weekly rechecks of BP while becoming established on meds. After visit, TC received from client that she is at [facility] to get vision eval and glasses but cannot pay the required $39 for retina scan--which is not covered by her medical insurance. Needed to cancel appointment. Follow up planned with resources such as [employer] to see if possible assistance for retinal scan. Client to RTC in 1 week for BP and glucose screening. Rhermann, RN

## 2022-05-14 NOTE — Congregational Nurse Program (Signed)
[facility] into [facility] updating nurse on progress on outstanding goals and for BP check. States she is glad nurse helped her get started with connections. Received glasses; completed physical exam with only area of concerns with labs being cholesterol; BP improving on meds (taking daily as prescribed). BP today 123/88. Reinforced keeping dental appointments, limiting salt intake, follow up w/ PCP as scheduled on 12/18. New PCP is [facility] on [place]. To follow up in this clinic as needed. Rhermann, RN

## 2022-05-21 NOTE — Congregational Nurse Program (Signed)
  Dept: [phone]   Congregational Nurse Program Note  Date of Encounter: 05/21/2022  Past Medical History: Past Medical History:  Diagnosis Date   Depression    GERD (gastroesophageal reflux disease)    Hypertension     Encounter Details:  CNP Questionnaire - 05/21/22 1215       Questionnaire   Ask client: Do you give verbal consent for me to treat you today? Yes    Student Assistance N/A    Location Patient Information systems manager, [employer]    Visit Setting with Hospital doctor    Patient Status Unknown    Insurance Medicare;Private or VA Insurance    Insurance/Financial Assistance Referral N/A    Medication N/A    Medical Provider Yes    Screening Referrals Made N/A    Medical Referrals Made N/A    Medical Appointment Made N/A    Recently w/o PCP, now 1st time PCP visit completed due to CNs referral or appointment made N/A    Food Have Food Insecurities    Transportation N/A    Housing/Utilities N/A    Interpersonal Safety N/A    Interventions Advocate/Support;Counsel    Abnormal to Normal Screening Since Last CN Visit N/A    Screenings CN Performed Blood Pressure    Sent Client to Lab for: N/A    Did client attend any of the following based off CNs referral or appointments made? N/A    ED Visit Averted N/A    Life-Saving Intervention Made N/A           Client into nurse only clinic for ongoing monitoring of BP as stabilizing on new medication to treat HTN. Takes meds as prescribed each afternoon. Has limited salt and sugar in diet recently. Has increased family stress this week; helping daughter re-group her family situation. Glad to have reconcilliation begin between herself and her brother. Reinforced improving BP and following up with meds as prescribed and keeping upcoming health appointments. Emotional support re: family issues. RTC as needed. Rhermann, RN

## 2022-05-28 NOTE — Congregational Nurse Program (Signed)
  Dept: [phone]   Congregational Nurse Program Note  Date of Encounter: 05/28/2022  Past Medical History: Past Medical History:  Diagnosis Date   Depression    GERD (gastroesophageal reflux disease)    Hypertension     Encounter Details:  CNP Questionnaire - 05/28/22 1407       Questionnaire   Ask client: Do you give verbal consent for me to treat you today? Yes    Student Assistance N/A    Location Patient Information systems manager, [employer]    Visit Setting with Hospital doctor    Patient Status Unknown    Insurance Medicare;Private or VA Insurance    Insurance/Financial Assistance Referral N/A    Medication N/A    Medical Provider Yes    Screening Referrals Made N/A    Medical Referrals Made N/A    Medical Appointment Made N/A    Recently w/o PCP, now 1st time PCP visit completed due to CNs referral or appointment made N/A    Food Have Food Insecurities    Transportation N/A    Housing/Utilities N/A    Interpersonal Safety N/A    Interventions Advocate/Support;Counsel    Abnormal to Normal Screening Since Last CN Visit N/A    Screenings CN Performed Blood Pressure    Sent Client to Lab for: N/A    Did client attend any of the following based off CNs referral or appointments made? N/A    ED Visit Averted N/A    Life-Saving Intervention Made N/A            [facility] for ongoing BP monitoring with recent start of BP meds. States she is limiting salt and sugar. States she takes meds about 4 pm each day. Admits to family stress today with daughter's marriage and granddaughter needing dental care asap. States she feels good. BP upon arrival was 152/100. After sitting 20 minutes BP was 144/86. States she has PCP appointment 12/18. Co re continued monitoring BP, taking meds as prescribed, finding ways to decrease stress such as brief walks. Client agrees. Co that this clinic will be closed to 07/02/22. Rhermann, RN

## 2022-11-27 ENCOUNTER — Other Ambulatory Visit: Payer: Self-pay | Admitting: Internal Medicine

## 2022-11-27 DIAGNOSIS — Z1231 Encounter for screening mammogram for malignant neoplasm of breast: Secondary | ICD-10-CM

## 2022-12-09 ENCOUNTER — Inpatient Hospital Stay: Admission: RE | Admit: 2022-12-09 | Payer: 59 | Source: Ambulatory Visit

## 2023-11-05 ENCOUNTER — Other Ambulatory Visit: Payer: Self-pay | Admitting: Internal Medicine

## 2023-11-05 DIAGNOSIS — Z780 Asymptomatic menopausal state: Secondary | ICD-10-CM

## 2023-11-05 DIAGNOSIS — Z1231 Encounter for screening mammogram for malignant neoplasm of breast: Secondary | ICD-10-CM

## 2024-04-28 ENCOUNTER — Encounter: Payer: Self-pay | Admitting: Internal Medicine

## 2024-05-10 ENCOUNTER — Ambulatory Visit
Admission: RE | Admit: 2024-05-10 | Discharge: 2024-05-10 | Disposition: A | Discharge status: Home / Self Care | Attending: Internal Medicine | Admitting: Internal Medicine

## 2024-05-10 ENCOUNTER — Encounter: Payer: Self-pay | Admitting: Internal Medicine

## 2024-05-10 ENCOUNTER — Encounter
Admission: RE | Disposition: A | Discharge status: Home / Self Care | Payer: Self-pay | Source: Home / Self Care | Attending: Internal Medicine

## 2024-05-10 ENCOUNTER — Ambulatory Visit

## 2024-05-10 DIAGNOSIS — D124 Benign neoplasm of descending colon: Secondary | ICD-10-CM | POA: Diagnosis not present

## 2024-05-10 DIAGNOSIS — D122 Benign neoplasm of ascending colon: Secondary | ICD-10-CM | POA: Insufficient documentation

## 2024-05-10 DIAGNOSIS — D123 Benign neoplasm of transverse colon: Secondary | ICD-10-CM | POA: Insufficient documentation

## 2024-05-10 DIAGNOSIS — D125 Benign neoplasm of sigmoid colon: Secondary | ICD-10-CM | POA: Insufficient documentation

## 2024-05-10 DIAGNOSIS — Z87891 Personal history of nicotine dependence: Secondary | ICD-10-CM | POA: Insufficient documentation

## 2024-05-10 DIAGNOSIS — K5730 Diverticulosis of large intestine without perforation or abscess without bleeding: Secondary | ICD-10-CM | POA: Diagnosis not present

## 2024-05-10 DIAGNOSIS — I10 Essential (primary) hypertension: Secondary | ICD-10-CM | POA: Insufficient documentation

## 2024-05-10 DIAGNOSIS — K219 Gastro-esophageal reflux disease without esophagitis: Secondary | ICD-10-CM | POA: Diagnosis not present

## 2024-05-10 DIAGNOSIS — Z1211 Encounter for screening for malignant neoplasm of colon: Secondary | ICD-10-CM | POA: Diagnosis present

## 2024-05-10 HISTORY — PX: POLYPECTOMY: SHX149

## 2024-05-10 HISTORY — PX: COLONOSCOPY: SHX5424

## 2024-05-10 HISTORY — PX: HEMOSTASIS CLIP PLACEMENT: SHX6857

## 2024-05-10 SURGERY — COLONOSCOPY
Anesthesia: General

## 2024-05-10 MED ORDER — PROPOFOL 1000 MG/100ML IV EMUL
INTRAVENOUS | Status: AC
  Filled 2024-05-10: qty 100

## 2024-05-10 MED ORDER — LIDOCAINE HCL (CARDIAC) PF 100 MG/5ML IV SOSY
PREFILLED_SYRINGE | INTRAVENOUS | Status: DC | PRN
  Administered 2024-05-10: 80 mg via INTRAVENOUS

## 2024-05-10 MED ORDER — PROPOFOL 10 MG/ML IV BOLUS
INTRAVENOUS | Status: AC
  Filled 2024-05-10: qty 20

## 2024-05-10 MED ORDER — PROPOFOL 500 MG/50ML IV EMUL
INTRAVENOUS | Status: DC | PRN
Start: 2024-05-10 — End: 2024-05-10
  Administered 2024-05-10: 100 ug/kg/min via INTRAVENOUS

## 2024-05-10 MED ORDER — LIDOCAINE HCL (PF) 2 % IJ SOLN
INTRAMUSCULAR | Status: AC
  Filled 2024-05-10: qty 5

## 2024-05-10 MED ORDER — SODIUM CHLORIDE 0.9 % IV SOLN
INTRAVENOUS | Status: DC
  Administered 2024-05-10: 500 mL via INTRAVENOUS

## 2024-05-10 MED ORDER — PROPOFOL 10 MG/ML IV BOLUS
INTRAVENOUS | Status: DC | PRN
  Administered 2024-05-10: 70 mg via INTRAVENOUS

## 2024-05-10 NOTE — Transfer of Care (Signed)
 Immediate Anesthesia Transfer of Care Note  Patient: Brittany Edwards  Procedure(s) Performed: COLONOSCOPY POLYPECTOMY, INTESTINE CONTROL OF HEMORRHAGE, GI TRACT, ENDOSCOPIC, BY CLIPPING OR OVERSEWING  Patient Location: PACU  Anesthesia Type:General  Level of Consciousness: drowsy  Airway & Oxygen Therapy: Patient Spontanous Breathing and Patient connected to face mask oxygen  Post-op Assessment: Report given to RN and Post -op Vital signs reviewed and stable  Post vital signs: Reviewed and stable  Last Vitals:  Vitals Value Taken Time  BP 133/70 05/10/24 11:17  Temp    Pulse 100 05/10/24 11:17  Resp 14 05/10/24 11:17  SpO2 100 % 05/10/24 11:17  Vitals shown include unfiled device data.  Last Pain:  Vitals:   05/10/24 0922  TempSrc: Temporal  PainSc: 0-No pain         Complications: No notable events documented.

## 2024-05-10 NOTE — H&P (Signed)
 Outpatient short stay form Pre-procedure 05/10/2024 9:51 AM Brittany Edwards K. Aundria, M.D.  Primary Physician: Ophelia Sage III, M.D.  Reason for visit:  Colon cancer screening  History of present illness:  Patient presents for colonoscopy for colon cancer screening. The patient denies complaints of abdominal pain, significant change in bowel habits, or rectal bleeding.      Current Facility-Administered Medications:    0.9 %  sodium chloride infusion, , Intravenous, Continuous, [place], Ladell POUR, MD, Last Rate: 20 mL/hr at 05/10/24 0938, 500 mL at 05/10/24 9061  Medications Prior to Admission  Medication Sig Dispense Refill Last Dose/Taking   hydrochlorothiazide (HYDRODIURIL) 25 MG tablet Take 25 mg by mouth daily.   05/09/2024 Morning   Magnesium Glycinate 100 MG CAPS Take 200 mg by mouth daily.   05/09/2024 Morning   Multiple Vitamin (MULTIVITAMIN) LIQD Take 5 mLs by mouth daily.   Past Week   pantoprazole (PROTONIX) 40 MG tablet Take 40 mg by mouth daily.   05/09/2024 Morning   spironolactone (ALDACTONE) 25 MG tablet Take 25 mg by mouth daily.   05/09/2024 Morning   Turmeric 10-998 MG CAPS Take 1 tablet by mouth daily.   05/09/2024 Morning     Allergies  Allergen Reactions   Codeine    Lisinopril Swelling     Past Medical History:  Diagnosis Date   Depression    GERD (gastroesophageal reflux disease)    Hypertension     Review of systems:  Otherwise negative.    Physical Exam  Gen: Alert, oriented. Appears stated age.  HEENT: [place]/AT. PERRLA. Lungs: CTA, no wheezes. CV: RR nl S1, S2. Abd: soft, benign, no masses. BS+ Ext: No edema. Pulses 2+    Planned procedures: Proceed with colonoscopy. The patient understands the nature of the planned procedure, indications, risks, alternatives and potential complications including but not limited to bleeding, infection, perforation, damage to internal organs and possible oversedation/side effects from anesthesia. The patient  agrees and gives consent to proceed.  Please refer to procedure notes for findings, recommendations and patient disposition/instructions.     Caitlynne Harbeck K. Aundria, M.D. Gastroenterology 05/10/2024  9:51 AM

## 2024-05-10 NOTE — Interval H&P Note (Signed)
 History and Physical Interval Note:  05/10/2024 9:52 AM  Brittany Edwards  has presented today for surgery, with the diagnosis of Screening for colon cancer [Z12.11].  The various methods of treatment have been discussed with the patient and family. After consideration of risks, benefits and other options for treatment, the patient has consented to  Procedure(s): COLONOSCOPY (N/A) as a surgical intervention.  The patient's history has been reviewed, patient examined, no change in status, stable for surgery.  I have reviewed the patient's chart and labs.  Questions were answered to the patient's satisfaction.     Chapman, Lenward Able

## 2024-05-10 NOTE — Op Note (Signed)
 [facility] Patient Name: Brittany Edwards Procedure Date: 05/10/2024 9:54 AM MRN: [id number] Account #: [id number] Date of Birth: [birth year] Admit Type: Outpatient Age: 68 Room: [facility] ENDO ROOM 3 Gender: Female Note Status: Finalized Instrument Name: Colon Scope [id number] Procedure:             Colonoscopy Indications:           Screening for colorectal malignant neoplasm Providers:             Fabian Walder K. Aundria MD, MD Referring MD:          Ophelia Sage, MD (Referring MD) Medicines:             Propofol per Anesthesia Complications:         No immediate complications. Estimated blood loss:                         Minimal. Procedure:             Pre-Anesthesia Assessment:                        - The risks and benefits of the procedure and the                         sedation options and risks were discussed with the                         patient. All questions were answered and informed                         consent was obtained.                        - Patient identification and proposed procedure were                         verified prior to the procedure by the nurse. The                         procedure was verified in the procedure room.                        - ASA Grade Assessment: III - A patient with severe                         systemic disease.                        - After reviewing the risks and benefits, the patient                         was deemed in satisfactory condition to undergo the                         procedure.                        After obtaining informed consent, the colonoscope was                         passed under direct  vision. Throughout the procedure,                         the patient's blood pressure, pulse, and oxygen                         saturations were monitored continuously. The                         Colonoscope was introduced through the anus and                         advanced to the the  cecum, identified by appendiceal                         orifice and ileocecal valve. The colonoscopy was                         performed without difficulty. The patient tolerated                         the procedure well. The quality of the bowel                         preparation was adequate. The ileocecal valve,                         appendiceal orifice, and rectum were photographed. Findings:      The perianal and digital rectal examinations were normal. Pertinent       negatives include normal sphincter tone and no palpable rectal lesions.      A few small-mouthed diverticula were found in the sigmoid colon.      An 11 mm polyp was found in the transverse colon. The polyp was sessile.       The polyp was removed with a hot snare. Resection and retrieval were       complete. Estimated blood loss: none.      A 20 mm polyp was found in the proximal ascending colon. The polyp was       carpet-like and flat. Area was unsuccessfully injected with 3 mL Eleview       for a lift polypectomy. The polyp was removed with a hot snare. The       polyp was removed with a piecemeal technique using a hot snare.       Resection and retrieval were complete. To prevent bleeding after the       polypectomy, one hemostatic clip was successfully placed (MR       conditional). Clip manufacturer: [employer]. There was no       bleeding at the end of the procedure. Estimated blood loss was minimal.      Three sessile polyps were found in the ascending colon. The polyps were       7 to 12 mm in size. These polyps were removed with a hot snare.       Resection and retrieval were complete. To prevent bleeding after the       polypectomy, two hemostatic clips were successfully placed (MR       conditional). Clip manufacturer: [employer]. There was no       bleeding at the end  of the procedure. Area was tattooed with an       injection of 3 mL of Spot (carbon black).      A 12 mm polyp was  found in the sigmoid colon. The polyp was       semi-pedunculated. The polyp was removed with a hot snare. Resection and       retrieval were complete. Estimated blood loss: none.      Two semi-pedunculated polyps were found in the descending colon. The       polyps were 10 to 12 mm in size. These polyps were removed with a hot       snare. Resection and retrieval were complete. Estimated blood loss: none.      The exam was otherwise without abnormality on direct and retroflexion       views. Impression:            - Diverticulosis in the sigmoid colon.                        - One 11 mm polyp in the transverse colon, removed                         with a hot snare. Resected and retrieved.                        - One 20 mm polyp in the proximal ascending colon,                         removed with a hot snare and removed piecemeal using a                         hot snare. Resected and retrieved. Treatment not                         successful. Clip (MR conditional) was placed. Clip                         manufacturer: [employer].                        - Three 7 to 12 mm polyps in the ascending colon,                         removed with a hot snare. Resected and retrieved.                         Clips (MR conditional) were placed. Clip manufacturer:                         [employer]. Tattooed.                        - One 12 mm polyp in the sigmoid colon, removed with a                         hot snare. Resected and retrieved.                        - Two 10 to 12 mm  polyps in the descending colon,                         removed with a hot snare. Resected and retrieved.                        - The examination was otherwise normal on direct and                         retroflexion views. Recommendation:        - Patient has a contact number available for                         emergencies. The signs and symptoms of potential                         delayed  complications were discussed with the patient.                         Return to normal activities tomorrow. Written                         discharge instructions were provided to the patient.                        - Resume previous diet.                        - Continue present medications.                        - Await pathology results.                        - Repeat colonoscopy for surveillance based on                         pathology results.                        - Return to GI office PRN.                        - The findings and recommendations were discussed with                         the patient. Procedure Code(s):     --- Professional ---                        [id number], Colonoscopy, flexible; with removal of                         tumor(s), polyp(s), or other lesion(s) by snare                         technique                        [id number], Colonoscopy, flexible; with directed submucosal  injection(s), any substance Diagnosis Code(s):     --- Professional ---                        K57.30, Diverticulosis of large intestine without                         perforation or abscess without bleeding                        D12.4, Benign neoplasm of descending colon                        D12.2, Benign neoplasm of ascending colon                        D12.5, Benign neoplasm of sigmoid colon                        D12.3, Benign neoplasm of transverse colon (hepatic                         flexure or splenic flexure)                        Z12.11, Encounter for screening for malignant neoplasm                         of colon CPT copyright 2022 [employer]. All rights reserved. The codes documented in this report are preliminary and upon coder review may  be revised to meet current compliance requirements. Ladell MARLA Boss MD, MD 05/10/2024 11:23:28 AM This report has been signed electronically. Number of Addenda: 0 Note Initiated  On: 05/10/2024 9:54 AM Scope Withdrawal Time: 0 hours 49 minutes 24 seconds  Total Procedure Duration: 0 hours 54 minutes 51 seconds  Estimated Blood Loss:  Estimated blood loss was minimal.      [facility]

## 2024-05-10 NOTE — Anesthesia Preprocedure Evaluation (Signed)
 Anesthesia Evaluation  Patient identified by MRN, date of birth, ID band Patient awake    Reviewed: Allergy & Precautions, H&P , NPO status , Patient's Chart, lab work & pertinent test results  Airway Mallampati: II  TM Distance: >3 FB Neck ROM: Full    Dental   Pulmonary former smoker   Pulmonary exam normal breath sounds clear to auscultation       Cardiovascular hypertension,  Rhythm:Regular     Neuro/Psych    Depression    negative neurological ROS     GI/Hepatic Neg liver ROS,GERD  ,,  Endo/Other  negative endocrine ROS    Renal/GU negative Renal ROS  negative genitourinary   Musculoskeletal negative musculoskeletal ROS (+)    Abdominal   Peds negative pediatric ROS (+)  Hematology negative hematology ROS (+)   Anesthesia Other Findings Morbid obesity  Reproductive/Obstetrics negative OB ROS                              Anesthesia Physical Anesthesia Plan  ASA: 2  Anesthesia Plan: General   Post-op Pain Management:    Induction:   PONV Risk Score and Plan: TIVA and Propofol infusion  Airway Management Planned:   Additional Equipment:   Intra-op Plan:   Post-operative Plan:   Informed Consent: I have reviewed the patients History and Physical, chart, labs and discussed the procedure including the risks, benefits and alternatives for the proposed anesthesia with the patient or authorized representative who has indicated his/her understanding and acceptance.       Plan Discussed with:   Anesthesia Plan Comments: (IVGA with LMA as backup)        Anesthesia Quick Evaluation

## 2024-05-10 NOTE — Anesthesia Postprocedure Evaluation (Signed)
 Anesthesia Post Note  Patient: Brittany Edwards  Procedure(s) Performed: COLONOSCOPY POLYPECTOMY, INTESTINE CONTROL OF HEMORRHAGE, GI TRACT, ENDOSCOPIC, BY CLIPPING OR OVERSEWING  Patient location during evaluation: PACU Anesthesia Type: General Level of consciousness: awake and alert Pain management: pain level controlled Vital Signs Assessment: post-procedure vital signs reviewed and stable Respiratory status: spontaneous breathing, nonlabored ventilation, respiratory function stable and patient connected to nasal cannula oxygen Cardiovascular status: blood pressure returned to baseline and stable Postop Assessment: no apparent nausea or vomiting Anesthetic complications: no   No notable events documented.   Last Vitals:  Vitals:   05/10/24 1121 05/10/24 1128  BP: 133/70 118/74  Pulse: 61 65  Resp: 14 (!) 21  Temp:    SpO2: 100% 99%    Last Pain:  Vitals:   05/10/24 1110  TempSrc: Temporal  PainSc:                  Windell Farr

## 2024-05-11 LAB — SURGICAL PATHOLOGY
